# Patient Record
Sex: Female | Born: 1965 | Race: White | Hispanic: No | Marital: Married | Smoking: Former smoker
Health system: Southern US, Community
[De-identification: ages and names within clinical notes are randomized; demographics above are authoritative.]

## PROBLEM LIST (undated history)

## (undated) DIAGNOSIS — Z923 Personal history of irradiation: Secondary | ICD-10-CM

## (undated) DIAGNOSIS — G709 Myoneural disorder, unspecified: Secondary | ICD-10-CM

## (undated) DIAGNOSIS — F329 Major depressive disorder, single episode, unspecified: Secondary | ICD-10-CM

## (undated) DIAGNOSIS — J45909 Unspecified asthma, uncomplicated: Secondary | ICD-10-CM

## (undated) DIAGNOSIS — F419 Anxiety disorder, unspecified: Secondary | ICD-10-CM

## (undated) DIAGNOSIS — C50919 Malignant neoplasm of unspecified site of unspecified female breast: Secondary | ICD-10-CM

## (undated) DIAGNOSIS — F32A Depression, unspecified: Secondary | ICD-10-CM

## (undated) DIAGNOSIS — J449 Chronic obstructive pulmonary disease, unspecified: Secondary | ICD-10-CM

## (undated) HISTORY — PX: ABDOMINAL HYSTERECTOMY: SHX81

---

## 2006-01-08 ENCOUNTER — Ambulatory Visit: Payer: Self-pay | Admitting: Internal Medicine

## 2007-11-29 ENCOUNTER — Ambulatory Visit: Payer: Self-pay | Admitting: Nurse Practitioner

## 2008-12-03 ENCOUNTER — Ambulatory Visit: Payer: Self-pay | Admitting: Nurse Practitioner

## 2009-09-04 ENCOUNTER — Ambulatory Visit: Payer: Self-pay | Admitting: Internal Medicine

## 2009-12-17 ENCOUNTER — Ambulatory Visit: Payer: Self-pay | Admitting: Nurse Practitioner

## 2010-02-18 ENCOUNTER — Ambulatory Visit: Payer: Self-pay | Admitting: Nurse Practitioner

## 2011-02-10 ENCOUNTER — Ambulatory Visit: Payer: Self-pay | Admitting: Family Medicine

## 2012-02-13 ENCOUNTER — Ambulatory Visit: Payer: Self-pay

## 2013-03-06 ENCOUNTER — Ambulatory Visit: Payer: Self-pay

## 2013-06-19 DIAGNOSIS — C50919 Malignant neoplasm of unspecified site of unspecified female breast: Secondary | ICD-10-CM

## 2013-06-19 DIAGNOSIS — Z923 Personal history of irradiation: Secondary | ICD-10-CM

## 2013-06-19 HISTORY — DX: Malignant neoplasm of unspecified site of unspecified female breast: C50.919

## 2013-06-19 HISTORY — DX: Personal history of irradiation: Z92.3

## 2013-12-19 DIAGNOSIS — F32A Depression, unspecified: Secondary | ICD-10-CM | POA: Insufficient documentation

## 2013-12-19 DIAGNOSIS — F329 Major depressive disorder, single episode, unspecified: Secondary | ICD-10-CM | POA: Insufficient documentation

## 2013-12-19 DIAGNOSIS — F419 Anxiety disorder, unspecified: Secondary | ICD-10-CM | POA: Insufficient documentation

## 2014-04-20 ENCOUNTER — Ambulatory Visit: Payer: Self-pay

## 2014-04-27 ENCOUNTER — Ambulatory Visit: Payer: Self-pay

## 2014-05-05 ENCOUNTER — Ambulatory Visit: Payer: Self-pay

## 2014-05-05 HISTORY — PX: BREAST BIOPSY: SHX20

## 2014-05-19 ENCOUNTER — Ambulatory Visit: Payer: Self-pay | Admitting: Surgery

## 2014-05-19 HISTORY — PX: BREAST BIOPSY: SHX20

## 2014-05-26 ENCOUNTER — Ambulatory Visit: Payer: Self-pay | Admitting: Oncology

## 2014-05-27 ENCOUNTER — Other Ambulatory Visit: Payer: Self-pay | Admitting: Oncology

## 2014-05-27 DIAGNOSIS — C50919 Malignant neoplasm of unspecified site of unspecified female breast: Secondary | ICD-10-CM

## 2014-05-28 ENCOUNTER — Ambulatory Visit: Payer: Self-pay | Admitting: Surgery

## 2014-06-01 ENCOUNTER — Ambulatory Visit
Admission: RE | Admit: 2014-06-01 | Discharge: 2014-06-01 | Disposition: A | Discharge status: Home / Self Care | Payer: 59 | Source: Ambulatory Visit | Attending: Oncology | Admitting: Oncology

## 2014-06-01 DIAGNOSIS — C50919 Malignant neoplasm of unspecified site of unspecified female breast: Secondary | ICD-10-CM

## 2014-06-01 MED ORDER — GADOBENATE DIMEGLUMINE 529 MG/ML IV SOLN
18.0000 mL | Freq: Once | INTRAVENOUS | Status: AC | PRN
  Administered 2014-06-01: 18 mL via INTRAVENOUS

## 2014-06-02 ENCOUNTER — Other Ambulatory Visit: Payer: Self-pay

## 2014-06-05 ENCOUNTER — Ambulatory Visit: Payer: Self-pay | Admitting: Surgery

## 2014-06-05 HISTORY — PX: BREAST LUMPECTOMY: SHX2

## 2014-06-19 ENCOUNTER — Ambulatory Visit: Payer: Self-pay | Admitting: Oncology

## 2014-07-20 ENCOUNTER — Ambulatory Visit: Payer: Self-pay | Admitting: Oncology

## 2014-08-18 ENCOUNTER — Ambulatory Visit
Admit: 2014-08-18 | Disposition: A | Discharge status: Home / Self Care | Payer: Self-pay | Attending: Oncology | Admitting: Oncology

## 2014-09-14 DIAGNOSIS — E785 Hyperlipidemia, unspecified: Secondary | ICD-10-CM | POA: Insufficient documentation

## 2014-09-18 ENCOUNTER — Ambulatory Visit
Admit: 2014-09-18 | Disposition: A | Discharge status: Home / Self Care | Payer: Self-pay | Attending: Oncology | Admitting: Oncology

## 2014-10-02 ENCOUNTER — Other Ambulatory Visit: Payer: Self-pay | Admitting: Oncology

## 2014-10-02 DIAGNOSIS — Z853 Personal history of malignant neoplasm of breast: Secondary | ICD-10-CM

## 2014-10-10 NOTE — Op Note (Signed)
PATIENT NAME:  Amanda Winters, Amanda Winters MR#:  [id number] DATE OF BIRTH:  1966-02-03  DATE OF PROCEDURE:  06/05/2014  PREOPERATIVE DIAGNOSIS: Ductal carcinoma in situ of the right breast.   POSTOPERATIVE DIAGNOSIS: Ductal carcinoma in situ of the right breast.   PROCEDURE: Right partial mastectomy.   SURGEON: Loreli Dollar, MD  ANESTHESIA: General.   INDICATIONS: This 49 year old female recently had a mammogram depicting a cluster of microcalcifications in the inferior aspect of the right breast. Stereotactic needle biopsy demonstrated ductal carcinoma in situ and surgery was recommended for definitive treatment.   DESCRIPTION OF PROCEDURE: The patient was placed on the operating table in the supine position under general anesthesia. The dressing was removed from the right breast, exposing the Kopans wire in the inferior aspect of the breast. I reviewed her mammogram images prior to incision. The wire was cut 2 cm from the skin and the breast was prepared with ChloraPrep and draped in a sterile manner.   A transversely oriented curvilinear incision was made just about 6 mm from the border of the areola. From approximately 4-o'clock to eight o'clock position of the right breast, an ellipse of skin was excised, which was approximately 1 cm in width. Dissection was carried down through subcutaneous tissues. Electrocautery was used for hemostasis. The dissection was carried down, noting the location of the Kopans wire and dissected down around the wire. There was some firmness in the breast tissue, which helped to direct the course of the dissection, and the dissection was carried down beyond the tip of the wire. The specimen was removed and was tagged with the margin maps to suture markers to the medial, lateral, cranial, caudal, and deep margins for the pathologist's orientation. There was no grossly palpable tumor at the cut edges. The specimen was submitted for specimen mammogram and pathology. The wound was  inspected and several tiny bleeding points were cauterized. One clamped vessel was suture ligated with 4-0 chromic. The wound was thoroughly inspected and determined hemostasis was intact. It was noted that during the course of the procedure, the radiologist called to report that the biopsy marker was seen within the specimen mammogram. A number of sites surrounding cautery artifact were infiltrated with 0.5% Sensorcaine with epinephrine. Subcutaneous tissues were infiltrated. The subcutaneous tissues were approximated with interrupted 4-0 chromic, and the skin was closed with running 4-0 Monocryl subcuticular suture.   I awaited the pathologist call, and pathologist advised that margins appeared to be good.   Subsequently, the incision was treated with LiquiBand adhesive glue. The patient appeared to tolerate the procedure satisfactorily and was prepared for transfer to the recovery room.   ____________________________ Lenna Sciara. Rochel Brome, MD jws:mw D: 06/05/2014 12:28:12 ET T: 06/05/2014 15:52:03 ET JOB#: [id number]  cc: Loreli Dollar, MD, <Dictator> Loreli Dollar MD ELECTRONICALLY SIGNED 06/08/2014 10:45

## 2014-10-12 LAB — SURGICAL PATHOLOGY

## 2014-10-18 NOTE — Consult Note (Signed)
Reason for Visit: This 49 year old Female patient presents to the clinic for initial evaluation of  breast cancer .   Referred by Dr. Tamala Julian.  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 She also had benign calcifications the left breast both areas underwent ultrasound-guided biopsy right breast showing ER/PR positive ductal carcinoma in situ.Jodelle Red wide local excision for 1.5 cm grade 3 ductal carcinoma in situ with clear margins at 5 mm. Patient seen by medical oncology and aromatase inhibitor therapy after completion of radiation has been recommended. She has done well postoperatively. She specifically denies breast tenderness cough or bone pain.  Past Hx:    breast cancer:    Bronchitis: Gets Bronchitis every year  Past, Family and Social History:  Past Medical History positive   Cardiovascular hyperlipidemia   Respiratory bronchitis; allergic rhinitis   Family History positive   Family History Comments grandmother at 28 with breast cancer   Social History noncontributory   Additional Past Medical and Surgical History seen by herself today   Allergies:   MSG: Swelling  Home Meds:  Home Medications: Medication Instructions Status  tamoxifen 20 mg oral tablet 1 tab(s) orally once a day Active  gabapentin 300 mg oral capsule 1 cap(s) orally 2 times a day Active   venlafaxine extended release 150 mg oral capsule, extended release 1 cap(s) orally once a day AM Active  Norco 325 mg-5 mg oral tablet 1 -2 tab(s) orally every 4 hours as needed for pain Active  Wellbutrin 150 mg/24 hours oral tablet, extended release 1 tab(s) orally  daily AM Active  vitamin E 1000 intl units oral capsule 1 cap(s) orally once a day Active  aspirin 81 mg oral tablet 1 tab(s) orally once a day Active   Review of Systems:  General negative   Performance Status (ECOG) 0   Skin negative   Breast see HPI   Ophthalmologic negative   ENMT negative   Respiratory and Thorax negative   Cardiovascular negative   Gastrointestinal negative   Genitourinary negative   Musculoskeletal negative   Neurological negative   Psychiatric negative   Hematology/Lymphatics negative   Endocrine negative   Allergic/Immunologic negative   Review of Systems   denies any weight loss, fatigue, weakness, fever, chills or night sweats. Patient denies any loss of vision, blurred vision. Patient denies any ringing  of the ears or hearing loss. No irregular heartbeat. Patient denies heart murmur or history of fainting. Patient denies any chest pain or pain radiating to her upper extremities. Patient denies any shortness of breath, difficulty breathing at night, cough or hemoptysis. Patient denies any swelling in the lower legs. Patient denies any nausea vomiting, vomiting of blood, or coffee ground material in the vomitus. Patient denies any stomach pain. Patient states has had normal bowel movements no significant constipation or diarrhea. Patient denies any dysuria, hematuria or significant nocturia. Patient denies any problems walking, swelling in the joints  or loss of balance. Patient denies any skin changes, loss of hair or loss of weight. Patient denies any excessive worrying or anxiety or significant depression. Patient denies any problems with insomnia. Patient denies excessive thirst,  polyuria, polydipsia. Patient denies any swollen glands, patient denies easy bruising or easy bleeding. Patient denies any recent infections, allergies or URI. Patient "s visual fields have not changed significantly in recent time.   Nursing Notes:  Nursing Vital Signs and Chemo Nursing Nursing Notes: *CC Vital Signs Flowsheet:   11-Jan-16 11:32  Temp Temperature 98  Pulse Pulse 79  SBP SBP 132  DBP DBP 88  Current Weight (kg) (kg) 90.9  Height (cm) centimeters 180.3  BSA (m2) 2.1   Physical Exam:  General/Skin/HEENT:  General normal   Skin normal   Eyes normal   ENMT normal   Head and Neck normal   Additional PE well-developed female in NAD breasts are pendulous.she is status post recent wide local excision in the inferior portion of the right nipple areolar complex. No dominant mass or nodularity is noted in either breast in 2 positions examined. No axillary or supraclavicular adenopathy is appreciated. Lungs are clear to A&P cardiac examination shows regar rate and rhythm.   Breasts/Resp/CV/GI/GU:  Respiratory and Thorax normal   Cardiovascular normal   Gastrointestinal normal   Genitourinary normal   MS/Neuro/Psych/Lymph:  Musculoskeletal normal   Neurological normal   Lymphatics normal   Other Results:  Radiology Results: [place]:    09-Nov-15 09:54, Digital Additional Views Bilateral  Digital Additional Views Bilateral   REASON FOR EXAM:    AV BIL CALCIFICATIONS  COMMENTS:       PROCEDURE: MAM - MAM DGTL  ADD VW  BIL SCR  - Apr 27 2014  9:54AM     ADDENDUM REPORT: 04/27/2014 15:19    ADDENDUM:  There is a laterality error in the recommendations. Short-term  follow-upis for the left breast calcifications, with biopsy  recommended for the right breast calcifications.      Electronically Signed    By: Lajean Manes M.D.    On: 04/27/2014 15:19    CLINICAL DATA:  Call back from screening for bilateral  breast  calcifications.    EXAM:  DIGITAL DIAGNOSTIC  BILATERAL MAMMOGRAM    COMPARISON:  Prior exams    ACR Breast Density Category c: The breast tissue is heterogeneously  dense, which may obscure small masses.    FINDINGS:  On the right, there is a discrete group of mildly pleomorphic  calcifications in the inferior aspect of the right breast, anterior  third, new from the prior exams. There is no associated mass or  distortion    On the left, calcifications are noted posteriorly, best seen on the  magnification CC view. These lie in the posterior left breast near  the 11 o'clock position. There is no associated mass or distortion.  They are not well seen on the magnification mL view. They are  punctate configuration.     IMPRESSION:  1. New calcifications on the right are suspicious. Biopsy is  recommended.  2. Probably benign calcifications on the left. Short-term follow-up  recommended.  RECOMMENDATION:  1. Biopsy of the right breast calcifications. They are amenable to  stereotactic core needle biopsy.  2. Short-term follow-up of the right breast calcifications with  repeat diagnostic mammography in 6 months.    I have discussed the findings and recommendations with the patient.  Results were also provided in writing at  the conclusion of the  visit. If applicable, a reminder letter will be sent to the patient  regarding the next appointment.    BI-RADS CATEGORY  4: Suspicious.    Electronically Signed:  By: Lajean Manes M.D.  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 Based on her large breast size hypofractionated course of treatment is not  indicated. We'll plan on delivering 5000 cGy over 5 weeks boosting her scar another 1400 cGy aced on close margin of 5 mm. Risks and benefits of treatment including skin reaction, fatigue, alteration of blood counts, inclusion of superficial lung on the right side all were explained in detail to the patient. She seems to comprehend her treatment plan well. I have ordered and scheduled CT simulation later this week. Patient also be candidate for tamoxifen therapy after completion of radiation and that will be arranged by medical oncology.  I would like to take this opportunity for allowing me to participate in the care of your patient..  Fax to Physician:  Physicians To Recieve Fax: Rochel Brome, MD - [phone].  Electronic Signatures: Armstead Peaks (MD)  (Signed 11-Jan-16 12:30)  Authored: HPI, Diagnosis, Past Hx, PFSH, Allergies, Home Meds, ROS, Nursing Notes, Physical Exam, Other Results, Relevent Results, Encounter Assessment and Plan, Fax to Physician   Last Updated: 11-Jan-16 12:30 by Armstead Peaks (MD)

## 2015-03-09 DIAGNOSIS — N939 Abnormal uterine and vaginal bleeding, unspecified: Secondary | ICD-10-CM | POA: Insufficient documentation

## 2015-03-09 DIAGNOSIS — Z1211 Encounter for screening for malignant neoplasm of colon: Secondary | ICD-10-CM | POA: Insufficient documentation

## 2015-03-25 ENCOUNTER — Other Ambulatory Visit: Payer: Self-pay | Admitting: Oncology

## 2015-04-15 ENCOUNTER — Ambulatory Visit: Payer: Self-pay | Admitting: Radiation Oncology

## 2015-05-04 ENCOUNTER — Telehealth: Payer: Self-pay | Admitting: *Deleted

## 2015-05-04 NOTE — Telephone Encounter (Signed)
Message left to notify patient of appointment changes for Dr. Baruch Gouty, Lab and Dr. Oliva Bustard.

## 2015-05-10 ENCOUNTER — Ambulatory Visit
Admission: RE | Admit: 2015-05-10 | Discharge: 2015-05-10 | Disposition: A | Discharge status: Home / Self Care | Payer: 59 | Source: Ambulatory Visit | Attending: Oncology | Admitting: Oncology

## 2015-05-10 DIAGNOSIS — Z853 Personal history of malignant neoplasm of breast: Secondary | ICD-10-CM | POA: Insufficient documentation

## 2015-05-10 HISTORY — DX: Malignant neoplasm of unspecified site of unspecified female breast: C50.919

## 2015-05-20 ENCOUNTER — Ambulatory Visit: Payer: Self-pay | Admitting: Oncology

## 2015-05-20 ENCOUNTER — Other Ambulatory Visit: Payer: Self-pay

## 2015-05-20 ENCOUNTER — Ambulatory Visit: Payer: Self-pay | Admitting: Radiation Oncology

## 2015-05-21 ENCOUNTER — Encounter
Admission: RE | Admit: 2015-05-21 | Discharge: 2015-05-21 | Disposition: A | Discharge status: Home / Self Care | Payer: 59 | Source: Ambulatory Visit | Attending: Obstetrics and Gynecology | Admitting: Obstetrics and Gynecology

## 2015-05-21 DIAGNOSIS — Z01818 Encounter for other preprocedural examination: Secondary | ICD-10-CM | POA: Insufficient documentation

## 2015-05-21 HISTORY — DX: Major depressive disorder, single episode, unspecified: F32.9

## 2015-05-21 HISTORY — DX: Depression, unspecified: F32.A

## 2015-05-21 HISTORY — DX: Unspecified asthma, uncomplicated: J45.909

## 2015-05-21 HISTORY — DX: Anxiety disorder, unspecified: F41.9

## 2015-05-21 LAB — TYPE AND SCREEN
ABO/RH(D): O POS
ANTIBODY SCREEN: NEGATIVE

## 2015-05-21 LAB — ABO/RH: ABO/RH(D): O POS

## 2015-05-21 NOTE — Patient Instructions (Signed)
  Your procedure is scheduled on: May 31, 2915 (Monday) Report to Day Surgery.[facility]) Second Floor To find out your arrival time please call [phone] between 1PM - 3PM on May 28, 2015 (Friday).  Remember: Instructions that are not followed completely may result in serious medical risk, up to and including death, or upon the discretion of your surgeon and anesthesiologist your surgery may need to be rescheduled.    __x_ 1. Do not eat food or drink liquids after midnight. No gum chewing or hard candies.     __x__ 2. No Alcohol for 24 hours before or after surgery.   ____ 3. Bring all medications with you on the day of surgery if instructed.    __x__ 4. Notify your doctor if there is any change in your medical condition     (cold, fever, infections).     Do not wear jewelry, make-up, hairpins, clips or nail polish.  Do not wear lotions, powders, or perfumes. You may wear deodorant.  Do not shave 48 hours prior to surgery. Men may shave face and neck.  Do not bring valuables to the hospital.    [facility] is not responsible for any belongings or valuables.               Contacts, dentures or bridgework may not be worn into surgery.  Leave your suitcase in the car. After surgery it may be brought to your room.  For patients admitted to the hospital, discharge time is determined by your                treatment team.   Patients discharged the day of surgery will not be allowed to drive home.   Please read over the following fact sheets that you were given:   Surgical Site Infection Prevention   _x_ Take these medicines the morning of surgery with A SIP OF WATER:    1. Wellbutrin  2. Tamoxifen  3.   4.  5.  6.  ____ Fleet Enema (as directed)   __x__ Use CHG Soap as directed  ____ Use inhalers on the day of surgery  ____ Stop metformin 2 days prior to surgery    ____ Take 1/2 of usual insulin dose the night before surgery and none on the morning of  surgery.   ____ Stop Coumadin/Plavix/aspirin on   __x__ Stop Anti-inflammatories on (Stop Ibuprofen now)   ____ Stop supplements until after surgery.    ____ Bring C-Pap to the hospital.

## 2015-05-21 NOTE — H&P (Signed)
Patient ID: Amanda Winters is a 49 y.o. female presenting with Pre Op Consulting  on 04/28/2015  HPI: AUB - menorrhagia, and thickened endometrium, on tamoxifen x7 months.   The patient underwent a transvaginal pelvic sonogram and SIS.  One fundal fibroid seen=39.7 mm Endometrium= 11.98 mm Pt on tamoxifen Neither ov seen No adn masses seen Several endometrial polyps were noted in the irregular endometrial canal.  Pap smear negative 03/09/15 EMBx negative 03/10/15 with secretory features.   Hx of DCIS in right breast, ER+, on tamoxifen  Past Medical History:  has a past medical history of Allergic rhinitis; Anxiety, unspecified; Constipation; COPD (chronic obstructive pulmonary disease); DCIS (ductal carcinoma in situ) (11/15); Depression; Hemorrhoids; Hyperlipidemia; Obesity; Perimenopause; and TMJ (temporomandibular joint syndrome).  Past Surgical History:  has a past surgical history that includes MISCARRIAGE (1999) and Breast surgery (Right, 06/05/2014). Family History: family history includes Depression in her sister; Diabetes mellitus in her mother; Diabetes type II in her mother; HIV in her brother; Hypertension in her father; Leukemia in her sister; Lymphoma in her brother; Skin cancer in her sister; Stroke in her father. Social History:  reports that she has quit smoking. She started smoking about 16 years ago. She has never used smokeless tobacco. She reports that she drinks alcohol. She reports that she does not use illicit drugs. OB/GYN History:  OB History    Gravida Para Term Preterm AB TAB SAB Ectopic Multiple Living   2 1 1  1     1       Allergies: has No Known Allergies. Medications:  Current Outpatient Prescriptions:  . buPROPion (WELLBUTRIN XL) 150 MG XL tablet, Take 1 tablet (150 mg total) by mouth once daily., Disp: 90 tablet, Rfl: 3 . diphenhydrAMINE (BENADRYL) 25 mg capsule, Take 25 mg by mouth every 6 (six) hours as needed for Itching., Disp: , Rfl:   . tamoxifen (NOLVADEX) 20 MG tablet, Take 20 mg by mouth once daily., Disp: , Rfl:  . venlafaxine (EFFEXOR-XR) 75 MG XR capsule, Take 1 capsule (75 mg total) by mouth once daily., Disp: 30 capsule, Rfl: 1  Review of Systems  Constitutional: Negative. Negative for fatigue and fever.  HENT: Negative.  Respiratory: Negative. Negative for shortness of breath.  Cardiovascular: Negative for chest pain and palpitations.  Gastrointestinal: Negative for abdominal pain, constipation and diarrhea.  Endocrine: Negative for cold intolerance.  Genitourinary: Positive for menstrual problem. Negative for difficulty urinating, dyspareunia, dysuria, frequency, hematuria, pelvic pain, vaginal bleeding, vaginal discharge and vaginal pain.  Abnormal uterine bleeding  Neurological: Negative for dizziness and light-headedness.  Psychiatric/Behavioral:  No changes in mood    Exam:         Visit Vitals  . BP 108/75  . Pulse 103  . Wt 90 kg (198 lb 6.4 oz)  . BMI 29.3 kg/m2   Physical Exam  Constitutional: She is oriented to person, place, and time. She appears well-developed and well-nourished. No distress.  Eyes: No scleral icterus.  Neck: Normal range of motion. Neck supple. No tracheal deviation present. No thyromegaly present.  Cardiovascular: Normal rate, regular rhythm and normal heart sounds.  No murmur heard. Pulmonary/Chest: Effort normal and breath sounds normal. She has no wheezes. She has no rales.  Abdominal: She exhibits no distension and no mass. There is no tenderness. There is no rebound and no guarding.  Genitourinary:  Genitourinary Comments:  Pelvic exam: External: Tanner stage 5, normal female genitalia without lesions or masses Bladder: Normal size without masses or tenderness,  well-supported Urethra: No lesions or discharge with palpation. Normal urethral size and location, no prolapse Vagina: normal physiological discharge, without lesions or masses Cervix: normal  without lesions or masses Adnexa: normal bimanual exam without masses or fullness Uterus: Normal size and position without masses or tenderness.  Anus/Perineum: Normal external exam  Musculoskeletal: Normal range of motion.  Lymphadenopathy:  She has no cervical adenopathy.  Neurological: She is alert and oriented to person, place, and time.  Skin: Skin is warm and dry.  Psychiatric: She has a normal mood and affect.          Impression:   The primary encounter diagnosis was Abnormal uterine bleeding (AUB). Diagnoses of Adverse effect of tamoxifen and Endometrial polyp were also pertinent to this visit.    Plan:   Patient returns for a preoperative discussion regarding her plans to proceed with definitive surgical treatment of her AUB and endometrial polyps with ER positive breast cancer and secretory endometrium on tamoxifen by total vaginal hysterectomy with bilateral salpingoophorectomy. We discussed at length the risk/benefit ratio of removing ovaries, which I do recommend at age 17. I spoke with her breast surgeon about this as well.  The patient and I discussed the technical aspects of the procedure including the potential for risks and complications. These include but are not limited to the risk of infection requiring post-operative antibiotics or further procedures. We talked about the risk of injury to adjacent organs including bladder, bowel, ureter, blood vessels or nerves. We talked about the need to convert to an open incision. We talked about the possible need for blood transfusion. We talked aboutpostop complications such asthromboembolic or cardiopulmonary complications. All of her questions were answered. Her preoperative exam was completed and the appropriate consents were signed. She is scheduled to undergo this procedure in the near future.  Return in about 2 weeks (around 05/12/2015) for Postop check.

## 2015-05-24 ENCOUNTER — Other Ambulatory Visit: Payer: Self-pay | Admitting: *Deleted

## 2015-05-24 DIAGNOSIS — C50919 Malignant neoplasm of unspecified site of unspecified female breast: Secondary | ICD-10-CM

## 2015-05-27 ENCOUNTER — Inpatient Hospital Stay: Payer: 59 | Attending: Oncology

## 2015-05-27 ENCOUNTER — Ambulatory Visit
Admission: RE | Admit: 2015-05-27 | Discharge: 2015-05-27 | Disposition: A | Discharge status: Home / Self Care | Payer: 59 | Source: Ambulatory Visit | Attending: Radiation Oncology | Admitting: Radiation Oncology

## 2015-05-27 ENCOUNTER — Encounter: Payer: Self-pay | Admitting: Oncology

## 2015-05-27 ENCOUNTER — Inpatient Hospital Stay (HOSPITAL_BASED_OUTPATIENT_CLINIC_OR_DEPARTMENT_OTHER): Payer: 59 | Admitting: Oncology

## 2015-05-27 ENCOUNTER — Encounter: Payer: Self-pay | Admitting: Radiation Oncology

## 2015-05-27 VITALS — BP 124/85 | HR 99 | Temp 98.0°F | Resp 18 | Wt 202.2 lb

## 2015-05-27 DIAGNOSIS — D0511 Intraductal carcinoma in situ of right breast: Secondary | ICD-10-CM | POA: Diagnosis not present

## 2015-05-27 DIAGNOSIS — Z923 Personal history of irradiation: Secondary | ICD-10-CM | POA: Diagnosis not present

## 2015-05-27 DIAGNOSIS — Z79899 Other long term (current) drug therapy: Secondary | ICD-10-CM | POA: Insufficient documentation

## 2015-05-27 DIAGNOSIS — F329 Major depressive disorder, single episode, unspecified: Secondary | ICD-10-CM | POA: Insufficient documentation

## 2015-05-27 DIAGNOSIS — J45909 Unspecified asthma, uncomplicated: Secondary | ICD-10-CM

## 2015-05-27 DIAGNOSIS — E785 Hyperlipidemia, unspecified: Secondary | ICD-10-CM

## 2015-05-27 DIAGNOSIS — Z170 Estrogen receptor positive status [ER+]: Secondary | ICD-10-CM | POA: Insufficient documentation

## 2015-05-27 DIAGNOSIS — F419 Anxiety disorder, unspecified: Secondary | ICD-10-CM | POA: Insufficient documentation

## 2015-05-27 DIAGNOSIS — Z803 Family history of malignant neoplasm of breast: Secondary | ICD-10-CM

## 2015-05-27 DIAGNOSIS — Z79810 Long term (current) use of selective estrogen receptor modulators (SERMs): Secondary | ICD-10-CM | POA: Insufficient documentation

## 2015-05-27 DIAGNOSIS — Z87891 Personal history of nicotine dependence: Secondary | ICD-10-CM | POA: Diagnosis not present

## 2015-05-27 DIAGNOSIS — N939 Abnormal uterine and vaginal bleeding, unspecified: Secondary | ICD-10-CM | POA: Diagnosis not present

## 2015-05-27 DIAGNOSIS — Z853 Personal history of malignant neoplasm of breast: Secondary | ICD-10-CM

## 2015-05-27 DIAGNOSIS — D0510 Intraductal carcinoma in situ of unspecified breast: Secondary | ICD-10-CM

## 2015-05-27 NOTE — Progress Notes (Signed)
Patient scheduled today to see NP, however, patient wanted to see Dr. Oliva Bustard.  States she is having surgery on Monday and needs to speak with him concerning issues she is having.  States her labs done at preadmit testing revealed her BUN and Creatnine to be elevated.  She also c/o being dizzy and nauseated.  States she started Tamoxifen in March and had heavy bleeding for a month.  She is scheduled for hysterectomy Monday.  States she stopped taking Effexor.  Also complains of muscle pain.  States she works out daily and does not understand why the pain.  Further states she gets diarrhea immediately after eating.

## 2015-05-27 NOTE — Progress Notes (Signed)
Radiation Oncology Follow up Note  Name: Amanda Winters   Date:   05/27/2015 MRN:  [id number] DOB: Apr 14, 1966    This 49 y.o. female presents to the clinic today for follow-up for ductal carcinoma in situ status post wide local excision and adjuvant radiation therapy to her right breast..  REFERRING PROVIDER: No ref. provider found  HPI: Patient is a 49 year old female now out close to year having completed radiation therapy to her right breast for ductal carcinoma in situ status post wide local excision. Tumor was ER/PR positive.. She is currently on tamoxifen tolerating that well without side effect. She specifically denies breast tenderness cough or bone pain. Mammograms performed in November were fine showing no evidence of disease.  COMPLICATIONS OF TREATMENT: none  FOLLOW UP COMPLIANCE: keeps appointments   PHYSICAL EXAM:  There were no vitals taken for this visit. Lungs are clear to A&P cardiac examination essentially unremarkable with regular rate and rhythm. No dominant mass or nodularity is noted in either breast in 2 positions examined. Incision is well-healed. No axillary or supraclavicular adenopathy is appreciated. Cosmetic result is excellent. Well-developed well-nourished patient in NAD. HEENT reveals PERLA, EOMI, discs not visualized.  Oral cavity is clear. No oral mucosal lesions are identified. Neck is clear without evidence of cervical or supraclavicular adenopathy. Lungs are clear to A&P. Cardiac examination is essentially unremarkable with regular rate and rhythm without murmur rub or thrill. Abdomen is benign with no organomegaly or masses noted. Motor sensory and DTR levels are equal and symmetric in the upper and lower extremities. Cranial nerves II through XII are grossly intact. Proprioception is intact. No peripheral adenopathy or edema is identified. No motor or sensory levels are noted. Crude visual fields are within normal range.  RADIOLOGY RESULTS: Mammograms are  reviewed  PLAN: At the present time she is doing well with no evidence of disease. I'm please were overall progress. I've asked to see her out in 1 year for follow-up. She continues on tamoxifen without side effect. Follow-up mammograms will be scheduled. She knows to call with any concerns.  I would like to take this opportunity for allowing me to participate in the care of your patient.Armstead Peaks., MD

## 2015-05-27 NOTE — Progress Notes (Signed)
[place] @ [facility] Telephone:([phone]  [phone]     KINGA FLATHERS OB: 1965/11/13  MR#: [id number]  [id number]  Patient Care Team: Sofie Hartigan, MD as PCP - General (Family Medicine)  CHIEF COMPLAINT: Oncology history Subjective: Chief Complaint/Diagnosis:   1,ductal carcinoma in situ high-gradecomedo pattern  diagnosis by ultrasound-guided biopsy of the right breast tis N0 M0 tumor(at 6:00 position) november 17th, 2015. 2.biopsy of left breastnegative for any malignancy. 3.status post lumpectomyDecember 18, 2015. Ductal  carcinoma in situ.No evidence of invasive cancer.  Estrogen receptor positive.  Progesterone receptor positive. Tis N0 M0 tumor Patient has finished radiation therapy now on tamoxifen HPI:    INTERVAL HISTORY: Patient was on tamoxifen and developed vaginal bleeding endometrial biopsy was negative.  Possibility of hysterectomy was planned.  Nephrectomy was recommended by gynecologist.  Patient is extremely anxious.  Emotional.  Depressed.  And has number of questions.  REVIEW OF SYSTEMS:   As described about patient does not want to continue tamoxifentamoxifen. Dawn other hand extremely worried about not taking any anti-hormonal therapy and spared up cancer Then moistened complains for the side effect of anti-hormonal therapy. Worried about oophorectomy.  And then worried about ovarian cancer.  Extremely emotional.  Patient is going through hysterectomy on Monday and oophorectomy has been planned along with it. Patient had number of menopausal symptoms.  Mood swings.  Heart flashes. As per HPI. Otherwise, a complete review of systems is negatve.  PAST MEDICAL HISTORY: Past Medical History  Diagnosis Date  . Breast cancer ([place]) 2015    right breast cancer  . Asthma     no inhalers  . Anxiety   . Depression     PAST SURGICAL HISTORY: Past Surgical History  Procedure Laterality Date  . Breast biopsy Right 05/05/2014    Stereo biopsy  (positive)  . Breast biopsy Left 05/19/2014    stereo biopsy (negative)  . Breast lumpectomy Right 06/05/2014    right breast lumpectomy - chemo & radiation treatment    FAMILY HISTORY Family History  Problem Relation Age of Onset  . Breast cancer Paternal Grandmother   . Diabetes Mother   . Hypercholesterolemia Mother   . Dementia Mother     ADVANCED DIRECTIVES:  No flowsheet data found.  HEALTH MAINTENANCE: Social History  Substance Use Topics  . Smoking status: Former Smoker -- 1.00 packs/day    Types: Cigarettes    Quit date: 09/18/1998  . Smokeless tobacco: Never Used  . Alcohol Use: Yes     Comment: occ   Significant History/PMH:   breast cancer:    Bronchitis: Gets Bronchitis every year  Preventive Screening:  Has patient had any of the following test? Mammography   Last Mammography: 2015   Smoking History: Smoking History Never Smoked.  PFSH: Family History: noncontributory  Comments: grandmother at age   81  had breast tumor  Social History: negative alcohol, negative tobacco  Additional Past Medical and Surgical History: allergic rhinitis..  Hypercholesteremia.     No Known Allergies  Current Outpatient Prescriptions  Medication Sig Dispense Refill  . buPROPion (WELLBUTRIN XL) 150 MG 24 hr tablet Take by mouth.    . venlafaxine XR (EFFEXOR-XR) 75 MG 24 hr capsule Take by mouth.    Marland Kitchen buPROPion (WELLBUTRIN XL) 150 MG 24 hr tablet Take 150 mg by mouth daily.    . diphenhydrAMINE (BENADRYL) 25 mg capsule Take by mouth.    Marland Kitchen ibuprofen (ADVIL,MOTRIN) 200 MG tablet Take 400 mg by mouth  every 6 (six) hours as needed.    . tamoxifen (NOLVADEX) 20 MG tablet Take 1 tablet by mouth once a day 90 tablet 0  . tamoxifen (NOLVADEX) 20 MG tablet Take by mouth.    . venlafaxine XR (EFFEXOR-XR) 75 MG 24 hr capsule   1   No current facility-administered medications for this visit.    OBJECTIVE:  There were no vitals filed for this visit.   There is no weight  on file to calculate BMI.    ECOG FS:0 - Asymptomatic  PHYSICAL EXAM: Entire visit was spent in discussion regarding hysterectomy, oophorectomy, continuing tamoxifen versus any other anti-hormonal therapy  Patient also has an appointment with radiation oncologist who is currently examined breast and other systemic evaluation LAB RESULTS:  No flowsheet data found.  No visits with results within 5 Day(s) from this visit. Latest known visit with results is:  Hospital Outpatient Visit on 05/21/2015  Component Date Value Ref Range Status  . ABO/RH(D) 05/21/2015 O POS   Final  . Antibody Screen 05/21/2015 NEG   Final  . Sample Expiration 05/21/2015 06/04/2015   Final  . Extend sample reason 05/21/2015 NO TRANSFUSIONS OR PREGNANCY IN THE PAST 3 MONTHS   Final  . ABO/RH(D) 05/21/2015 O POS   Final      STUDIES: Mm Diag Breast Tomo Bilateral  05/10/2015  CLINICAL DATA:  2015 lumpectomy inferior right breast, history of benign stereotactic biopsy left calcifications 05/19/2014 12 o'clock position EXAM: DIGITAL DIAGNOSTIC BILATERAL MAMMOGRAM WITH 3D TOMOSYNTHESIS AND CAD COMPARISON:  Previous exam(s). ACR Breast Density Category c: The breast tissue is heterogeneously dense, which may obscure small masses. FINDINGS: Postsurgical scar lower outer quadrant right breast. No suspicious associated findings. No suspicious findings on the left. An area of perceived distortion in the medial left breast did not persist on spot compression or true lateral views performed with tomography. Mammographic images were processed with CAD. IMPRESSION: Benign postoperative appearing RECOMMENDATION: Diagnostic bilateral mammogram in 1 year I have discussed the findings and recommendations with the patient. Results were also provided in writing at the conclusion of the visit. If applicable, a reminder letter will be sent to the patient regarding the next appointment. BI-RADS CATEGORY  2: Benign. Electronically Signed    By: Skipper Cliche M.D.   On: 05/10/2015 10:52    ASSESSMENT: Carcinoma in situ on tamoxifen Patient is perimenopausal Has vaginal bleeding getting planned for hysterectomy nephrectomy Has number of questions which were answered.  Entire visit was spent in discussion.  Reevaluate patient after hysterectomy nephrectomy about possibility of continuing anti-hormonal therapy Patient was advised to stop tamoxifen at present time Duration of visit is 20  minutes and 50% of time was spent discussing  Patient expressed understanding and was in agreement with this plan. She also understands that She can call clinic at any time with any questions, concerns, or complaints.    No matching staging information was found for the patient.  Forest Gleason, MD   05/27/2015 2:19 PM

## 2015-05-29 ENCOUNTER — Encounter: Payer: Self-pay | Admitting: Oncology

## 2015-05-31 ENCOUNTER — Ambulatory Visit: Payer: 59 | Admitting: Anesthesiology

## 2015-05-31 ENCOUNTER — Observation Stay
Admission: RE | Admit: 2015-05-31 | Discharge: 2015-06-01 | Disposition: A | Discharge status: Home / Self Care | Payer: 59 | Source: Ambulatory Visit | Attending: Obstetrics and Gynecology | Admitting: Obstetrics and Gynecology

## 2015-05-31 ENCOUNTER — Encounter: Payer: Self-pay | Admitting: *Deleted

## 2015-05-31 ENCOUNTER — Encounter
Admission: RE | Disposition: A | Discharge status: Home / Self Care | Payer: Self-pay | Source: Ambulatory Visit | Attending: Obstetrics and Gynecology

## 2015-05-31 DIAGNOSIS — Z9889 Other specified postprocedural states: Secondary | ICD-10-CM

## 2015-05-31 DIAGNOSIS — Z853 Personal history of malignant neoplasm of breast: Secondary | ICD-10-CM | POA: Diagnosis not present

## 2015-05-31 DIAGNOSIS — K5900 Constipation, unspecified: Secondary | ICD-10-CM | POA: Diagnosis not present

## 2015-05-31 DIAGNOSIS — Z833 Family history of diabetes mellitus: Secondary | ICD-10-CM | POA: Insufficient documentation

## 2015-05-31 DIAGNOSIS — E785 Hyperlipidemia, unspecified: Secondary | ICD-10-CM | POA: Insufficient documentation

## 2015-05-31 DIAGNOSIS — J449 Chronic obstructive pulmonary disease, unspecified: Secondary | ICD-10-CM | POA: Diagnosis not present

## 2015-05-31 DIAGNOSIS — E669 Obesity, unspecified: Secondary | ICD-10-CM | POA: Insufficient documentation

## 2015-05-31 DIAGNOSIS — Z808 Family history of malignant neoplasm of other organs or systems: Secondary | ICD-10-CM | POA: Diagnosis not present

## 2015-05-31 DIAGNOSIS — M26609 Unspecified temporomandibular joint disorder, unspecified side: Secondary | ICD-10-CM | POA: Diagnosis not present

## 2015-05-31 DIAGNOSIS — N8312 Corpus luteum cyst of left ovary: Secondary | ICD-10-CM | POA: Diagnosis not present

## 2015-05-31 DIAGNOSIS — N939 Abnormal uterine and vaginal bleeding, unspecified: Secondary | ICD-10-CM | POA: Diagnosis present

## 2015-05-31 DIAGNOSIS — Z87891 Personal history of nicotine dependence: Secondary | ICD-10-CM | POA: Diagnosis not present

## 2015-05-31 DIAGNOSIS — J309 Allergic rhinitis, unspecified: Secondary | ICD-10-CM | POA: Insufficient documentation

## 2015-05-31 DIAGNOSIS — Z6829 Body mass index (BMI) 29.0-29.9, adult: Secondary | ICD-10-CM | POA: Diagnosis not present

## 2015-05-31 DIAGNOSIS — Z807 Family history of other malignant neoplasms of lymphoid, hematopoietic and related tissues: Secondary | ICD-10-CM | POA: Insufficient documentation

## 2015-05-31 DIAGNOSIS — N888 Other specified noninflammatory disorders of cervix uteri: Principal | ICD-10-CM | POA: Insufficient documentation

## 2015-05-31 DIAGNOSIS — Z170 Estrogen receptor positive status [ER+]: Secondary | ICD-10-CM | POA: Insufficient documentation

## 2015-05-31 DIAGNOSIS — N920 Excessive and frequent menstruation with regular cycle: Secondary | ICD-10-CM | POA: Insufficient documentation

## 2015-05-31 DIAGNOSIS — Z8249 Family history of ischemic heart disease and other diseases of the circulatory system: Secondary | ICD-10-CM | POA: Diagnosis not present

## 2015-05-31 DIAGNOSIS — N8311 Corpus luteum cyst of right ovary: Secondary | ICD-10-CM | POA: Diagnosis not present

## 2015-05-31 DIAGNOSIS — F329 Major depressive disorder, single episode, unspecified: Secondary | ICD-10-CM | POA: Diagnosis not present

## 2015-05-31 DIAGNOSIS — F419 Anxiety disorder, unspecified: Secondary | ICD-10-CM | POA: Diagnosis not present

## 2015-05-31 DIAGNOSIS — Z823 Family history of stroke: Secondary | ICD-10-CM | POA: Diagnosis not present

## 2015-05-31 DIAGNOSIS — Z818 Family history of other mental and behavioral disorders: Secondary | ICD-10-CM | POA: Diagnosis not present

## 2015-05-31 DIAGNOSIS — D259 Leiomyoma of uterus, unspecified: Secondary | ICD-10-CM | POA: Diagnosis not present

## 2015-05-31 DIAGNOSIS — Z79899 Other long term (current) drug therapy: Secondary | ICD-10-CM | POA: Diagnosis not present

## 2015-05-31 DIAGNOSIS — R938 Abnormal findings on diagnostic imaging of other specified body structures: Secondary | ICD-10-CM | POA: Diagnosis not present

## 2015-05-31 HISTORY — PX: SALPINGOOPHORECTOMY: SHX82

## 2015-05-31 HISTORY — PX: CYSTOSCOPY: SHX5120

## 2015-05-31 HISTORY — PX: VAGINAL HYSTERECTOMY: SHX2639

## 2015-05-31 LAB — CBC
HCT: 36.7 % (ref 35.0–47.0)
Hemoglobin: 12 g/dL (ref 12.0–16.0)
MCH: 29.9 pg (ref 26.0–34.0)
MCHC: 32.7 g/dL (ref 32.0–36.0)
MCV: 91.6 fL (ref 80.0–100.0)
PLATELETS: 295 10*3/uL (ref 150–440)
RBC: 4.01 MIL/uL (ref 3.80–5.20)
RDW: 13.3 % (ref 11.5–14.5)
WBC: 17.7 10*3/uL — AB (ref 3.6–11.0)

## 2015-05-31 LAB — BASIC METABOLIC PANEL
ANION GAP: 5 (ref 5–15)
BUN: 14 mg/dL (ref 6–20)
CALCIUM: 8.5 mg/dL — AB (ref 8.9–10.3)
CO2: 26 mmol/L (ref 22–32)
Chloride: 109 mmol/L (ref 101–111)
Creatinine, Ser: 0.68 mg/dL (ref 0.44–1.00)
Glucose, Bld: 149 mg/dL — ABNORMAL HIGH (ref 65–99)
POTASSIUM: 4.1 mmol/L (ref 3.5–5.1)
SODIUM: 140 mmol/L (ref 135–145)

## 2015-05-31 LAB — POCT PREGNANCY, URINE: PREG TEST UR: NEGATIVE

## 2015-05-31 SURGERY — HYSTERECTOMY, VAGINAL
Anesthesia: General | Site: Vagina | Wound class: Clean Contaminated

## 2015-05-31 MED ORDER — ONDANSETRON HCL 4 MG/2ML IJ SOLN
INTRAMUSCULAR | Status: DC | PRN
  Administered 2015-05-31: 4 mg via INTRAVENOUS

## 2015-05-31 MED ORDER — ROCURONIUM BROMIDE 100 MG/10ML IV SOLN
INTRAVENOUS | Status: DC | PRN
  Administered 2015-05-31 (×2): 20 mg via INTRAVENOUS
  Administered 2015-05-31: 40 mg via INTRAVENOUS
  Administered 2015-05-31 (×2): 10 mg via INTRAVENOUS

## 2015-05-31 MED ORDER — IBUPROFEN 600 MG PO TABS
600.0000 mg | ORAL_TABLET | Freq: Four times a day (QID) | ORAL | Status: DC | PRN
  Administered 2015-06-01 (×2): 600 mg via ORAL
  Filled 2015-05-31: qty 1

## 2015-05-31 MED ORDER — MENTHOL 3 MG MT LOZG: 1.0000 | LOZENGE | OROMUCOSAL | Status: DC | PRN

## 2015-05-31 MED ORDER — OXYCODONE-ACETAMINOPHEN 5-325 MG PO TABS: 1.0000 | ORAL_TABLET | ORAL | Status: DC | PRN

## 2015-05-31 MED ORDER — CYCLOBENZAPRINE HCL 10 MG PO TABS
10.0000 mg | ORAL_TABLET | Freq: Three times a day (TID) | ORAL | Status: DC | PRN
Start: 2015-05-31 — End: 2015-06-01
  Administered 2015-05-31: 10 mg via ORAL
  Filled 2015-05-31 (×2): qty 1

## 2015-05-31 MED ORDER — DOCUSATE SODIUM 100 MG PO CAPS
100.0000 mg | ORAL_CAPSULE | Freq: Two times a day (BID) | ORAL | Status: DC
  Administered 2015-06-01: 100 mg via ORAL
  Filled 2015-05-31: qty 1

## 2015-05-31 MED ORDER — HYDROMORPHONE HCL 1 MG/ML IJ SOLN: 0.2000 mg | INTRAMUSCULAR | Status: DC | PRN

## 2015-05-31 MED ORDER — HYDROMORPHONE HCL 1 MG/ML IJ SOLN
0.2000 mg | INTRAMUSCULAR | Status: DC | PRN
  Administered 2015-05-31 – 2015-06-01 (×2): 0.6 mg via INTRAVENOUS
  Filled 2015-05-31 (×2): qty 1

## 2015-05-31 MED ORDER — LIDOCAINE-EPINEPHRINE 1 %-1:100000 IJ SOLN
INTRAMUSCULAR | Status: DC | PRN
  Administered 2015-05-31: 16 mL

## 2015-05-31 MED ORDER — ONDANSETRON HCL 4 MG PO TABS: 4.0000 mg | ORAL_TABLET | Freq: Four times a day (QID) | ORAL | Status: DC | PRN

## 2015-05-31 MED ORDER — PROPOFOL 10 MG/ML IV BOLUS
INTRAVENOUS | Status: DC | PRN
  Administered 2015-05-31: 170 mg via INTRAVENOUS

## 2015-05-31 MED ORDER — IBUPROFEN 600 MG PO TABS: 600.0000 mg | ORAL_TABLET | Freq: Four times a day (QID) | ORAL | Status: DC | PRN

## 2015-05-31 MED ORDER — FENTANYL CITRATE (PF) 100 MCG/2ML IJ SOLN
INTRAMUSCULAR | Status: DC | PRN
  Administered 2015-05-31 (×8): 50 ug via INTRAVENOUS

## 2015-05-31 MED ORDER — LACTATED RINGERS IV SOLN: INTRAVENOUS | Status: DC

## 2015-05-31 MED ORDER — ONDANSETRON HCL 4 MG/2ML IJ SOLN: 4.0000 mg | Freq: Four times a day (QID) | INTRAMUSCULAR | Status: DC | PRN

## 2015-05-31 MED ORDER — BUPROPION HCL ER (XL) 150 MG PO TB24
150.0000 mg | ORAL_TABLET | Freq: Every day | ORAL | Status: DC
  Administered 2015-06-01: 150 mg via ORAL
  Filled 2015-05-31 (×2): qty 1

## 2015-05-31 MED ORDER — HYDROMORPHONE HCL 1 MG/ML IJ SOLN
0.2500 mg | INTRAMUSCULAR | Status: DC | PRN
  Administered 2015-05-31 (×4): 0.25 mg via INTRAVENOUS

## 2015-05-31 MED ORDER — KETOROLAC TROMETHAMINE 30 MG/ML IJ SOLN: 30.0000 mg | Freq: Once | INTRAMUSCULAR | Status: DC

## 2015-05-31 MED ORDER — CEFAZOLIN SODIUM-DEXTROSE 2-3 GM-% IV SOLR
2.0000 g | INTRAVENOUS | Status: AC
  Administered 2015-05-31 (×2): 2 g via INTRAVENOUS

## 2015-05-31 MED ORDER — OXYCODONE-ACETAMINOPHEN 5-325 MG PO TABS
1.0000 | ORAL_TABLET | ORAL | Status: DC | PRN
  Administered 2015-06-01 (×2): 2 via ORAL
  Filled 2015-05-31 (×2): qty 2

## 2015-05-31 MED ORDER — LACTATED RINGERS IV SOLN
INTRAVENOUS | Status: DC
  Administered 2015-05-31 – 2015-06-01 (×2): via INTRAVENOUS

## 2015-05-31 MED ORDER — SUGAMMADEX SODIUM 200 MG/2ML IV SOLN
INTRAVENOUS | Status: DC | PRN
  Administered 2015-05-31: 183.2 mg via INTRAVENOUS

## 2015-05-31 MED ORDER — DEXAMETHASONE SODIUM PHOSPHATE 10 MG/ML IJ SOLN
INTRAMUSCULAR | Status: DC | PRN
  Administered 2015-05-31: 5 mg via INTRAVENOUS

## 2015-05-31 MED ORDER — KETOROLAC TROMETHAMINE 30 MG/ML IJ SOLN
30.0000 mg | Freq: Once | INTRAMUSCULAR | Status: AC
  Administered 2015-05-31: 30 mg via INTRAVENOUS

## 2015-05-31 MED ORDER — KETOROLAC TROMETHAMINE 30 MG/ML IJ SOLN: 30.0000 mg | Freq: Four times a day (QID) | INTRAMUSCULAR | Status: DC

## 2015-05-31 MED ORDER — MIDAZOLAM HCL 2 MG/2ML IJ SOLN
INTRAMUSCULAR | Status: DC | PRN
  Administered 2015-05-31: 2 mg via INTRAVENOUS

## 2015-05-31 MED ORDER — DOCUSATE SODIUM 100 MG PO CAPS: 100.0000 mg | ORAL_CAPSULE | Freq: Two times a day (BID) | ORAL | Status: DC

## 2015-05-31 MED ORDER — LACTATED RINGERS IV SOLN
INTRAVENOUS | Status: DC
Start: 2015-05-31 — End: 2015-05-31
  Administered 2015-05-31 (×3): via INTRAVENOUS

## 2015-05-31 MED ORDER — FENTANYL CITRATE (PF) 100 MCG/2ML IJ SOLN
25.0000 ug | INTRAMUSCULAR | Status: DC | PRN
  Administered 2015-05-31 (×4): 25 ug via INTRAVENOUS

## 2015-05-31 MED ORDER — ONDANSETRON HCL 4 MG/2ML IJ SOLN: 4.0000 mg | Freq: Once | INTRAMUSCULAR | Status: DC | PRN

## 2015-05-31 MED ORDER — CEFAZOLIN SODIUM-DEXTROSE 2-3 GM-% IV SOLR
INTRAVENOUS | Status: DC | PRN
  Administered 2015-05-31: 2 g via INTRAVENOUS

## 2015-05-31 MED ORDER — PHENYLEPHRINE HCL 10 MG/ML IJ SOLN
INTRAMUSCULAR | Status: DC | PRN
  Administered 2015-05-31 (×2): 100 ug via INTRAVENOUS

## 2015-05-31 MED ORDER — CEFAZOLIN SODIUM-DEXTROSE 2-3 GM-% IV SOLR
2.0000 g | INTRAVENOUS | Status: AC
  Administered 2015-05-31: 2 g via INTRAVENOUS

## 2015-05-31 MED ORDER — LIDOCAINE HCL (CARDIAC) 20 MG/ML IV SOLN
INTRAVENOUS | Status: DC | PRN
  Administered 2015-05-31: 100 mg via INTRAVENOUS

## 2015-05-31 SURGICAL SUPPLY — 51 items
BAG URO DRAIN 2000ML W/SPOUT (MISCELLANEOUS) ×7 IMPLANT
CANISTER SUCT 1200ML W/VALVE (MISCELLANEOUS) ×7 IMPLANT
CATH FOLEY 2WAY  5CC 16FR (CATHETERS) ×2
CATH URTH 16FR FL 2W BLN LF (CATHETERS) ×5 IMPLANT
CLOSURE WOUND 1/2 X4 (GAUZE/BANDAGES/DRESSINGS)
DRAPE LEGGINS SURG 28X43 STRL (DRAPES) ×7 IMPLANT
DRAPE SHEET LG 3/4 BI-LAMINATE (DRAPES) IMPLANT
ELECT BLADE 6.5 EXT (BLADE) ×7 IMPLANT
FILTER LAP SMOKE EVAC STRL (MISCELLANEOUS) IMPLANT
GAUZE PACK 2X3YD (MISCELLANEOUS) ×7 IMPLANT
GLOVE BIO SURGEON STRL SZ 6.5 (GLOVE) ×6 IMPLANT
GLOVE BIO SURGEONS STRL SZ 6.5 (GLOVE) ×1
GLOVE INDICATOR 7.0 STRL GRN (GLOVE) ×7 IMPLANT
GOWN STRL REUS W/ TWL LRG LVL3 (GOWN DISPOSABLE) ×10 IMPLANT
GOWN STRL REUS W/ TWL XL LVL3 (GOWN DISPOSABLE) ×5 IMPLANT
GOWN STRL REUS W/TWL LRG LVL3 (GOWN DISPOSABLE) ×4
GOWN STRL REUS W/TWL XL LVL3 (GOWN DISPOSABLE) ×2
IRRIGATION STRYKERFLOW (MISCELLANEOUS) IMPLANT
IRRIGATOR STRYKERFLOW (MISCELLANEOUS)
IV LACTATED RINGERS 1000ML (IV SOLUTION) IMPLANT
KIT RM TURNOVER CYSTO AR (KITS) IMPLANT
LABEL OR SOLS (LABEL) IMPLANT
LIGASURE BLUNT 5MM 37CM (INSTRUMENTS) IMPLANT
NDL SAFETY 22GX1.5 (NEEDLE) ×7 IMPLANT
NS IRRIG 500ML POUR BTL (IV SOLUTION) ×7 IMPLANT
PACK BASIN MINOR ARMC (MISCELLANEOUS) ×7 IMPLANT
PACK GYN LAPAROSCOPIC (MISCELLANEOUS) ×7 IMPLANT
PAD GROUND ADULT SPLIT (MISCELLANEOUS) ×7 IMPLANT
PAD OB MATERNITY 4.3X12.25 (PERSONAL CARE ITEMS) ×7 IMPLANT
PAD PREP 24X41 OB/GYN DISP (PERSONAL CARE ITEMS) IMPLANT
SET CYSTO W/LG BORE CLAMP LF (SET/KITS/TRAYS/PACK) IMPLANT
SHEARS HARMONIC ACE PLUS 36CM (ENDOMECHANICALS) IMPLANT
SLEEVE ENDOPATH XCEL 5M (ENDOMECHANICALS) IMPLANT
SPONGE XRAY 4X4 16PLY STRL (MISCELLANEOUS) ×14 IMPLANT
STRIP CLOSURE SKIN 1/2X4 (GAUZE/BANDAGES/DRESSINGS) IMPLANT
SURGILUBE 2OZ TUBE FLIPTOP (MISCELLANEOUS) ×21 IMPLANT
SUT PDS AB 2-0 CT1 27 (SUTURE) ×7 IMPLANT
SUT VIC AB 0 CT1 27 (SUTURE) ×8
SUT VIC AB 0 CT1 27XCR 8 STRN (SUTURE) ×20 IMPLANT
SUT VIC AB 0 CT1 36 (SUTURE) ×7 IMPLANT
SUT VIC AB 0 SH 27 (SUTURE) ×7 IMPLANT
SUT VIC AB 2-0 CT1 36 (SUTURE) ×7 IMPLANT
SUT VIC AB 2-0 SH 27 (SUTURE) ×2
SUT VIC AB 2-0 SH 27XBRD (SUTURE) ×5 IMPLANT
SUT VIC AB 2-0 UR6 27 (SUTURE) ×7 IMPLANT
SUT VIC AB 4-0 FS2 27 (SUTURE) ×7 IMPLANT
SYR CONTROL 10ML (SYRINGE) IMPLANT
SYRINGE 10CC LL (SYRINGE) ×7 IMPLANT
TROCAR ENDO BLADELESS 11MM (ENDOMECHANICALS) IMPLANT
TROCAR XCEL NON-BLD 5MMX100MML (ENDOMECHANICALS) IMPLANT
TUBING INSUFFLATOR HEATED (MISCELLANEOUS) IMPLANT

## 2015-05-31 NOTE — Interval H&P Note (Signed)
History and Physical Interval Note:  05/31/2015 4:34 PM  Amanda Winters  has presented today for surgery, with the diagnosis of AUB WITH BREAST CANCER  The various methods of treatment have been discussed with the patient and family. After consideration of risks, benefits and other options for treatment, the patient has consented to  Procedure(s): HYSTERECTOMY VAGINAL a, salpingectomy and bilateral oopherectomy as a surgical intervention .  The patient's history has been reviewed, patient examined, no change in status, stable for surgery.  I have reviewed the patient's chart and labs.  Questions were answered to the patient's satisfaction.     Benjaman Kindler

## 2015-05-31 NOTE — Anesthesia Preprocedure Evaluation (Signed)
Anesthesia Evaluation  Patient identified by MRN, date of birth, ID band Patient awake    Reviewed: Allergy & Precautions, H&P , NPO status , Patient's Chart, lab work & pertinent test results, reviewed documented beta blocker date and time   Airway Mallampati: II  TM Distance: >3 FB Neck ROM: full    Dental  (+) Teeth Intact   Pulmonary neg pulmonary ROS, asthma , Current Smoker, former smoker,    Pulmonary exam normal        Cardiovascular negative cardio ROS Normal cardiovascular exam Rhythm:regular Rate:Normal     Neuro/Psych  Headaches, negative neurological ROS  negative psych ROS   GI/Hepatic negative GI ROS, Neg liver ROS,   Endo/Other  negative endocrine ROS  Renal/GU negative Renal ROS  negative genitourinary   Musculoskeletal   Abdominal   Peds  Hematology negative hematology ROS (+)   Anesthesia Other Findings   Reproductive/Obstetrics negative OB ROS                             Anesthesia Physical Anesthesia Plan  ASA: II  Anesthesia Plan: General ETT   Post-op Pain Management:    Induction:   Airway Management Planned:   Additional Equipment:   Intra-op Plan:   Post-operative Plan:   Informed Consent: I have reviewed the patients History and Physical, chart, labs and discussed the procedure including the risks, benefits and alternatives for the proposed anesthesia with the patient or authorized representative who has indicated his/her understanding and acceptance.     Plan Discussed with: CRNA  Anesthesia Plan Comments:         Anesthesia Quick Evaluation

## 2015-05-31 NOTE — Transfer of Care (Signed)
Immediate Anesthesia Transfer of Care Note  Patient: Amanda Winters  Procedure(s) Performed: Procedure(s): HYSTERECTOMY VAGINAL (N/A) SALPINGO OOPHORECTOMY (Bilateral) CYSTOSCOPY  Patient Location: PACU  Anesthesia Type:General  Level of Consciousness: awake, alert , oriented and patient cooperative  Airway & Oxygen Therapy: Patient Spontanous Breathing and Patient connected to face mask oxygen  Post-op Assessment: Report given to RN and Post -op Vital signs reviewed and stable  Post vital signs: Reviewed and stable  Last Vitals:  Filed Vitals:   05/31/15 1350  BP: 134/118  Pulse: 93  Temp: 36.7 C  Resp: 18    Complications: No apparent anesthesia complications

## 2015-05-31 NOTE — Op Note (Signed)
Izell Carson Schiano PROCEDURE DATE: 05/31/2015  PREOPERATIVE DIAGNOSIS:   1. Abnormal uterine bleeding 2. Endometrial polyps 3. Hx of DCIS ER+ on tamoxifen  POSTOPERATIVE DIAGNOSIS:   Same  SURGEON:   Benjaman Kindler, M.D. ASSISTANT: Boykin Nearing, M.D. OPERATION:  Total Vaginal Hysterectomy, bilateral salpingectomy, bilateral oopherectomy, cystoscopy ANESTHESIA:  General endotracheal.  INDICATIONS: The patient is a 49 y.o. with the above history. The patient made a decision to undergo definite surgical treatment. On the preoperative visit, the risks, benefits, indications, and alternatives of the procedure were reviewed with the patient.  On the day of surgery, the risks of surgery were again discussed with the patient including but not limited to: bleeding which may require transfusion or reoperation; infection which may require antibiotics; injury to bowel, bladder, ureters or other surrounding organs; need for additional procedures; thromboembolic phenomenon, incisional problems and other postoperative/anesthesia complications. Written informed consent was obtained.    OPERATIVE FINDINGS: A 8 week size uterus with normal tubes and ovaries bilaterally.  ESTIMATED BLOOD LOSS: 450 ml FLUIDS:  2300 ml of Lactated Ringers URINE OUTPUT:  200 ml of clear yellow urine. SPECIMENS:  Uterus and cervix, tubes and ovaries sent to pathology COMPLICATIONS:  None immediate.  DESCRIPTION OF PROCEDURE:  The patient received prophylactic intravenous antibiotics and had sequential compression devices applied to her lower extremities while in the preoperative area.    She was taken to the operating room, where she was identified by name and birth date. General anesthesia was administered and was found to be adequate.  She was placed in the dorsal lithotomy position, and was prepped and draped in a sterile manner.  A formal time out procedure was performed with all team members present and in agreement.  A Foley catheter was inserted into her bladder and attached to gravity drainage. Attention was turned to her pelvis. Of note, all sutures used in this case were 0 Vicryl unless otherwise noted.     A weighted speculum was placed in the vagina, and the anterior and posterior lips of the cervix were grasped bilaterally with thyroid tenaculums.  The cervix was then injected circumferentially with 0.25% Marcaine with epinephrine solution to maintain hemostasis.  The cervix was circumferentially incised using electrocautery, and the posterior cul-de-sac was entered sharply in the midline.   A long weighted speculum was inserted into the posterior cul-de-sac. The bladder was dissected off the pubocervical fascia anteriorly with sharp and careful blunt dissection without complication.  The anterior cul-de-sac was then entered sharply without difficulty and the bladder retracted out of the operative field behind a retractor. The Heaney clamp was then used to clamp the uterosacral ligaments on either side.  They were then cut and sutured ligated with 0 Vicryl, and the ligated uterosacral ligaments were transfixed to the ipsilateral vaginal epithelium to further support the vagina and provide hemostasis. The cardinal ligaments were then clamped, cut and ligated. The uterine vessels and broad ligaments were then serially clamped with the Heaney clamps, cut, and suture ligated on both sides.  Excellent hemostasis was noted at this point.    The uterus was then delivered via the posterior cul-de-sac, and the cornua were clamped with the Heaney clamps, transected, and the uterine specimen was delivered and sent to pathology. These pedicles were then suture ligated to ensure hemostasis.   BILATERAL SALPINGOOPHERECTOMY The Fallopian tubes were then identified and individually grasped with Babcock clamps. They were clamped across entirely with a Heaney clamp and free-tied, followed by suture ligation for  excellent hemostasis  bilaterally. The ovaries were similarly removed. On the patient's right side, bleeding at the IP and pelvic sidewall was encountered, which accounted for most of the blood loss. This was controlled with 2-0 Vicryl sutures.  After completion of the hysterectomy, all pedicles from the uterosacral ligament to the cornua were examined hemostasis was confirmed.  The peritoneum was closed in a purse string fashion with 2-0 Vicryl, taking care not to incorporate any intraabdominal organs in the closure. The posterior peritoneum was fairly oozy, was was controlled with a stitch. The vaginal cuff was then closed with in a running locked fashion with care given to incorporate the uterosacral pedicles bilaterally, which were also tied in the midline.  All instruments were then removed from the pelvis.  CYSTOCOPY  The Foley catheter was temporarily removed and an uncomplicated cystoscopy was performed. Excellent efflux was noted from both ureteral orifices. The bladder mucosa was intact. The Foley catheter was replaced for postoperative care.   Vaginal packing was placed.  The patient tolerated the procedure well.  All instruments, needles, and sponge counts were correct x 2. The patient was taken to the recovery room in stable condition.    Angelina Pih, MD, MPH

## 2015-05-31 NOTE — Anesthesia Procedure Notes (Signed)
Procedure Name: Intubation Date/Time: 05/31/2015 5:06 PM Performed by: Aline Brochure Pre-anesthesia Checklist: Patient identified, Emergency Drugs available, Suction available and Patient being monitored Patient Re-evaluated:Patient Re-evaluated prior to inductionOxygen Delivery Method: Circle system utilized Preoxygenation: Pre-oxygenation with 100% oxygen Intubation Type: IV induction Ventilation: Mask ventilation without difficulty Laryngoscope Size: Mac and 3 Grade View: Grade I Tube type: Oral Tube size: 7.0 mm Number of attempts: 1 Airway Equipment and Method: Stylet Placement Confirmation: ETT inserted through vocal cords under direct vision,  positive ETCO2 and breath sounds checked- equal and bilateral Secured at: 22 cm Tube secured with: Tape Dental Injury: Teeth and Oropharynx as per pre-operative assessment

## 2015-06-01 ENCOUNTER — Encounter: Payer: Self-pay | Admitting: Obstetrics and Gynecology

## 2015-06-01 DIAGNOSIS — N888 Other specified noninflammatory disorders of cervix uteri: Secondary | ICD-10-CM | POA: Diagnosis not present

## 2015-06-01 LAB — BASIC METABOLIC PANEL
Anion gap: 4 — ABNORMAL LOW (ref 5–15)
BUN: 11 mg/dL (ref 6–20)
CHLORIDE: 107 mmol/L (ref 101–111)
CO2: 27 mmol/L (ref 22–32)
Calcium: 8.4 mg/dL — ABNORMAL LOW (ref 8.9–10.3)
Creatinine, Ser: 0.64 mg/dL (ref 0.44–1.00)
GFR calc Af Amer: >60 mL/min (ref >60)
GFR calc non Af Amer: >60 mL/min (ref >60)
GLUCOSE: 124 mg/dL — AB (ref 65–99)
POTASSIUM: 4.5 mmol/L (ref 3.5–5.1)
SODIUM: 138 mmol/L (ref 135–145)

## 2015-06-01 LAB — CBC
HEMATOCRIT: 32 % — AB (ref 35.0–47.0)
Hemoglobin: 10.7 g/dL — ABNORMAL LOW (ref 12.0–16.0)
MCH: 30.7 pg (ref 26.0–34.0)
MCHC: 33.6 g/dL (ref 32.0–36.0)
MCV: 91.3 fL (ref 80.0–100.0)
Platelets: 271 10*3/uL (ref 150–440)
RBC: 3.5 MIL/uL — AB (ref 3.80–5.20)
RDW: 13.3 % (ref 11.5–14.5)
WBC: 11.3 10*3/uL — AB (ref 3.6–11.0)

## 2015-06-01 MED ORDER — OXYCODONE-ACETAMINOPHEN 5-325 MG PO TABS: 1.0000 | ORAL_TABLET | ORAL | Status: DC | PRN

## 2015-06-01 MED ORDER — DOCUSATE SODIUM 100 MG PO CAPS: 100.0000 mg | ORAL_CAPSULE | Freq: Two times a day (BID) | ORAL | Status: DC

## 2015-06-01 MED ORDER — IBUPROFEN 600 MG PO TABS: 600.0000 mg | ORAL_TABLET | Freq: Four times a day (QID) | ORAL | Status: AC | PRN

## 2015-06-01 NOTE — Discharge Summary (Signed)
Physician Discharge Summary  Patient ID: Amanda Winters MRN: [id number] DOB/AGE: 25-Apr-1966 49 y.o.  Admit date: 05/31/2015 Discharge date: 06/01/2015  Admission Diagnoses: AUB, postop care  Discharge Diagnoses:  Active Problems:   Postoperative state   Discharged Condition: good  Hospital Course:  1 Day Post-Op Subjective: The patient is doing well.  No nausea or vomiting. Pain is adequately controlled. She is tolerating po diet, ambulating without complaint, voiding appropriately.  Objective: Vital signs in last 24 hours: Temp:  [97.5 F (36.4 C)-98.8 F (37.1 C)] 98.5 F (36.9 C) (12/13 0728) Pulse Rate:  [93-102] 96 (12/13 0728) Resp:  [10-18] 18 (12/13 0728) BP: (93-134)/(48-118) 93/53 mmHg (12/13 0728) SpO2:  [88 %-99 %] 97 % (12/13 0728) Weight:  [91.627 kg (202 lb)] 91.627 kg (202 lb) (12/12 1350)  Intake/Output  Intake/Output Summary (Last 24 hours) at 06/01/15 0901 Last data filed at 06/01/15 [id number]  Gross per 24 hour  Intake   3100 ml  Output   1925 ml  Net   1175 ml    Physical Exam:  General: Alert and oriented. CV: RRR Lungs: Clear bilaterally. GI: Soft, Nondistended. Incisions: Clean and dry. Urine: Clear Extremities: Nontender, no erythema, no edema.  Lab Results:  Recent Labs  05/31/15 2058 06/01/15 0634  HGB 12.0 10.7*  HCT 36.7 32.0*  WBC 17.7* 11.3*  PLT 295 271                 Results for orders placed or performed during the hospital encounter of 05/31/15 (from the past 24 hour(s))  Pregnancy, urine POC     Status: None   Collection Time: 05/31/15  1:49 PM  Result Value Ref Range   Preg Test, Ur NEGATIVE NEGATIVE  CBC     Status: Abnormal   Collection Time: 05/31/15  8:58 PM  Result Value Ref Range   WBC 17.7 (H) 3.6 - 11.0 K/uL   RBC 4.01 3.80 - 5.20 MIL/uL   Hemoglobin 12.0 12.0 - 16.0 g/dL   HCT 36.7 35.0 - 47.0 %   MCV 91.6 80.0 - 100.0 fL   MCH 29.9 26.0 - 34.0 pg   MCHC 32.7 32.0 - 36.0 g/dL   RDW 13.3 11.5 -  14.5 %   Platelets 295 150 - 440 K/uL  Basic metabolic panel     Status: Abnormal   Collection Time: 05/31/15  8:58 PM  Result Value Ref Range   Sodium 140 135 - 145 mmol/L   Potassium 4.1 3.5 - 5.1 mmol/L   Chloride 109 101 - 111 mmol/L   CO2 26 22 - 32 mmol/L   Glucose, Bld 149 (H) 65 - 99 mg/dL   BUN 14 6 - 20 mg/dL   Creatinine, Ser 0.68 0.44 - 1.00 mg/dL   Calcium 8.5 (L) 8.9 - 10.3 mg/dL   GFR calc non Af Amer >60 >60 mL/min   GFR calc Af Amer >60 >60 mL/min   Anion gap 5 5 - 15  CBC     Status: Abnormal   Collection Time: 06/01/15  6:34 AM  Result Value Ref Range   WBC 11.3 (H) 3.6 - 11.0 K/uL   RBC 3.50 (L) 3.80 - 5.20 MIL/uL   Hemoglobin 10.7 (L) 12.0 - 16.0 g/dL   HCT 32.0 (L) 35.0 - 47.0 %   MCV 91.3 80.0 - 100.0 fL   MCH 30.7 26.0 - 34.0 pg   MCHC 33.6 32.0 - 36.0 g/dL   RDW 13.3 11.5 - 14.5 %  Platelets 271 150 - 440 K/uL  Basic metabolic panel     Status: Abnormal   Collection Time: 06/01/15  6:34 AM  Result Value Ref Range   Sodium 138 135 - 145 mmol/L   Potassium 4.5 3.5 - 5.1 mmol/L   Chloride 107 101 - 111 mmol/L   CO2 27 22 - 32 mmol/L   Glucose, Bld 124 (H) 65 - 99 mg/dL   BUN 11 6 - 20 mg/dL   Creatinine, Ser 0.64 0.44 - 1.00 mg/dL   Calcium 8.4 (L) 8.9 - 10.3 mg/dL   GFR calc non Af Amer >60 >60 mL/min   GFR calc Af Amer >60 >60 mL/min   Anion gap 4 (L) 5 - 15    Assessment/Plan: POD# 1 s/p TVH, BSO.  1) Ambulate, Incentive spirometry 2) Advance diet as tolerated 3) Transition to oral pain medication 4) D/C Foley 5) Discharge home today anticipated    Benjaman Kindler, MD     Benjaman Kindler 06/01/2015, 9:01 AM    Medication List    STOP taking these medications        acetaminophen 500 MG tablet  Commonly known as:  TYLENOL     tamoxifen 20 MG tablet  Commonly known as:  NOLVADEX      TAKE these medications        buPROPion 150 MG 24 hr tablet  Commonly known as:  WELLBUTRIN XL  Take 150 mg by mouth daily.      diphenhydrAMINE 25 mg capsule  Commonly known as:  BENADRYL  Take by mouth.     docusate sodium 100 MG capsule  Commonly known as:  COLACE  Take 1 capsule (100 mg total) by mouth 2 (two) times daily.     ibuprofen 600 MG tablet  Commonly known as:  ADVIL,MOTRIN  Take 1 tablet (600 mg total) by mouth every 6 (six) hours as needed (mild pain).     oxyCODONE-acetaminophen 5-325 MG tablet  Commonly known as:  PERCOCET/ROXICET  Take 1-2 tablets by mouth every 4 (four) hours as needed (moderate to severe pain (when tolerating fluids)).           Follow-up Information    Follow up with Benjaman Kindler, MD In 2 weeks.   Specialty:  Obstetrics and Gynecology   Why:  For postop check   Contact information:   [place] [address] [phone]       Signed: Benjaman Kindler 06/01/2015, 9:01 AM

## 2015-06-01 NOTE — Discharge Instructions (Signed)
Signs and Symptoms to Report Call our office at [phone] if you have any of the following.   Fever over 100.4 degrees or higher  Severe stomach pain not relieved with pain medications  Bright red bleeding thats heavier than a period that does not slow with rest  To go the bathroom a lot (frequency), you cant hold your urine (urgency), or it hurts when you empty your bladder (urinate)  Chest pain  Shortness of breath  Pain in the calves of your legs  Severe nausea and vomiting not relieved with anti-nausea medications  Signs of infection around your wounds, such as redness, hot to touch, swelling, green/yellow drainage (like pus), bad smelling discharge  Any concerns  What You Can Expect after Surgery  You may have a sore throat because of the tube in your mouth during general anesthesia. This will go away in 2 to 3 days.  You may have some stomach cramps.  You may notice spotting on your panties.  You may have pain around the incision sites.   Activities after Your Discharge Follow these guidelines to help speed your recovery at home:  Do the coughing and deep breathing as you did in the hospital for 2 weeks. Use the small blue breathing device, called the incentive spirometer for 2 weeks.  Dont drive if you are in pain or taking narcotic pain medicine. You may drive when you can safely slam on the brakes, turn the wheel forcefully, and rotate your torso comfortably. This is typically 1-2 weeks. Practice in a parking lot or side street prior to attempting to drive regularly.   Ask others to help with household chores for 4 weeks.  Do not lift anything heavier that 10 pounds for 4-6 weeks. This includes pets, children, and groceries.  Dont do strenuous activities, exercises, or sports like vacuuming, tennis, squash, etc. until your doctor says it is safe to do so. ---If you had a hysterectomy (abdominal, laparoscopic, or vaginal) do not have intercourse for 8  weeks.   Walk as you feel able. Rest often since it may take two or three weeks for your energy level to return to normal.   You may climb stairs  Avoid constipation:   -Eat fruits, vegetables, and whole grains. Eat small meals as your appetite will take time to return to normal.   -Drink 6 to 8 glasses of water each day unless your doctor has told you to limit your fluids.   -Use a laxative or stool softener as needed if constipation becomes a problem. You may take Miralax, metamucil, Citrucil, Colace, Senekot, FiberCon, etc. If this does not relieve the constipation, try two tablespoons of Milk Of Magnesia every 8 hours until your bowels move.   You may shower. Gently wash the wounds with a mild soap and water. Pat dry.  Do not get in a hot tub, swimming pool, etc. until your doctor agrees.  Do not use lotions, oils, powders on the wounds.  Do not douche, use tampons, or have sex until your doctor says it is okay.  Take your pain medicine when you need it. The medicine may not work as well if the pain is bad.  Take the medicines you were taking before surgery. Other medications you will need are pain medications (Norco or Percocet) and nausea medications (Zofran).

## 2015-06-01 NOTE — Progress Notes (Signed)
Patient understands all discharge instructions and the need to make follow up appointments. Patient discharge via wheelchair with auxillary. 

## 2015-06-02 LAB — SURGICAL PATHOLOGY

## 2015-06-15 NOTE — Anesthesia Postprocedure Evaluation (Signed)
Anesthesia Post Note  Patient: Amanda Winters  Procedure(s) Performed: Procedure(s) (LRB): HYSTERECTOMY VAGINAL (N/A) SALPINGO OOPHORECTOMY (Bilateral) CYSTOSCOPY  Patient location during evaluation: PACU Anesthesia Type: General Level of consciousness: awake and alert Pain management: pain level controlled Vital Signs Assessment: post-procedure vital signs reviewed and stable Respiratory status: spontaneous breathing, nonlabored ventilation, respiratory function stable and patient connected to nasal cannula oxygen Cardiovascular status: blood pressure returned to baseline and stable Postop Assessment: no signs of nausea or vomiting Anesthetic complications: no    Last Vitals:  Filed Vitals:   06/01/15 0455 06/01/15 0728  BP: 99/48 93/53  Pulse: 102 96  Temp: 37.1 C 36.9 C  Resp: 18 18    Last Pain:  Filed Vitals:   06/01/15 0945  PainSc: 5                  Martha Clan

## 2015-06-16 ENCOUNTER — Ambulatory Visit: Payer: 59 | Attending: Obstetrics and Gynecology | Admitting: Physical Therapy

## 2015-06-16 ENCOUNTER — Encounter: Payer: Self-pay | Admitting: Physical Therapy

## 2015-06-16 DIAGNOSIS — R279 Unspecified lack of coordination: Secondary | ICD-10-CM

## 2015-06-16 DIAGNOSIS — IMO0001 Reserved for inherently not codable concepts without codable children: Secondary | ICD-10-CM

## 2015-06-16 DIAGNOSIS — M609 Myositis, unspecified: Secondary | ICD-10-CM | POA: Diagnosis present

## 2015-06-16 DIAGNOSIS — M791 Myalgia: Secondary | ICD-10-CM | POA: Diagnosis not present

## 2015-06-16 DIAGNOSIS — R2689 Other abnormalities of gait and mobility: Secondary | ICD-10-CM | POA: Diagnosis present

## 2015-06-16 NOTE — Patient Instructions (Addendum)
No more sleeping with heating pad.  Only use it for 20 min/ hour.    Sleep with a pillow between your knees when sleeping on your side to keep good alignment.    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   Hold off weight training routine and no more sit-ups   Continue walking  ______________________________    3- way child's pose -5 breaths  To stretch back     Every day activities: maintain neutral spine  And activate deep core:   1)  : lifting something light off the floor:   Picking up light objects: A."counterbalance lift" technique with front of hips squared to floor as if there is an imaginary plate on your back that you are trying to keep from falling off of your back). Keep your standing knee slightly unlocked, and the toes of the back foot engaged, ankle at 90 deg so the toes point down  B. lunge with front knee above ankle and toes of back foot tucked under.  To rise, straighten back knee by lowering back heel down, pushing off the [place] of the back foot                  C. Mini squat , exhale on rise    2)  Out of chair:  Feet under knees, hands on hips, nose over toes, exhale on rise   3) Out of bed : log roll (see handout)

## 2015-06-17 NOTE — Therapy (Addendum)
[place] MAIN [facility] SERVICES [address] [place], [place], [address] Phone: [phone]   Fax:  [phone]  Physical Therapy Evaluation  Patient Details  Name: Amanda Winters MRN: [id number] Date of Birth: [birth year] No Data Recorded  Encounter Date: 06/16/2015      PT End of Session - 06/27/15 2048    Visit Number 1   Number of Visits 12   PT Start Time 1600   PT Stop Time [id number]   PT Time Calculation (min) 75 min   Activity Tolerance Patient tolerated treatment well;No increased pain   Behavior During Therapy [facility] for tasks assessed/performed      Past Medical History  Diagnosis Date  . Breast cancer ([place]) 2015    right breast cancer  . Asthma     no inhalers  . Anxiety   . Depression     Past Surgical History  Procedure Laterality Date  . Breast biopsy Right 05/05/2014    Stereo biopsy (positive)  . Breast biopsy Left 05/19/2014    stereo biopsy (negative)  . Breast lumpectomy Right 06/05/2014    right breast lumpectomy -  radiation treatment  . Vaginal hysterectomy N/A 05/31/2015    Procedure: HYSTERECTOMY VAGINAL;  Surgeon: Benjaman Kindler, MD;  Location: [facility] ORS;  Service: Gynecology;  Laterality: N/A;  . Salpingoophorectomy Bilateral 05/31/2015    Procedure: SALPINGO OOPHORECTOMY;  Surgeon: Benjaman Kindler, MD;  Location: [facility] ORS;  Service: Gynecology;  Laterality: Bilateral;  . Cystoscopy  05/31/2015    Procedure: CYSTOSCOPY;  Surgeon: Benjaman Kindler, MD;  Location: [facility] ORS;  Service: Gynecology;;    There were no vitals filed for this visit.  Visit Diagnosis:  Myalgia and myositis - Plan: CANCELED: PT plan of care cert/re-cert  Lack of coordination - Plan: CANCELED: PT plan of care cert/re-cert  Decreased mobility - Plan: CANCELED: PT plan of care cert/re-cert      Subjective Assessment - 06/27/15 2051    Subjective Pt reported chronic R LBP/hip pain posterior that radiates down to posterior knee 8/10.  This pain started in Sept 2016 after running.  Pt received one Tx of  deep tissue massage which decreased the pain from 8/10 to 4/10.   The pain returned severely after she underwent a hysterectomy on 12/12/ 2016.   Pt reported after surgery, pt was not able to walk 2/2  radiating pain bilaterally to heels R > L. Across the past 2 weeks, pt took pain meds, heating pad, aspirin, Ibuprofen, and rested from running. Today, pt feels the achy and throb pain with sitting. Pt is not able to lay on her R side, and sleeps on her L side or her back. When sleeping on her L side, pt does not use a pillow.  Pt 's pain interrupts her sleep 2-3x /night. Pain also increases with sit to stand, walking > past > 2 miles. Pt would like to return to running.    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), Prior to suregery, pt 's exercise routine:  walk 30 min, weight training 10#, dumbbells, sit-ups 200 reps, Hx of 1 vaginal delivery- 4th degree tear with stitches, s/p 2 weeks hysterectomy with restrictions no lifting 10 lbs, no vaccuuming, heavy lifting,    Patient Stated Goals reduce pain by 50% at a 4/10 instead 8/10, better ways for fitness and build core mm safely, return to running  Pain Onset 1 to 4 weeks ago            [facility] PT Assessment - 06/27/15 2044    Assessment   Medical Diagnosis acute LBP    Precautions   Precautions Other (comment)   Precaution Comments no lifting > 10 # , no vaccuuming    Restrictions   Weight Bearing Restrictions No   Home Ecologist residence   Prior Function   Level of Independence Independent   Observation/Other Assessments   Other Surveys  --  [place] 61.4%, [place] Anxiety 56%    Coordination   Gross Motor Movements are Fluid and Coordinated --  chest breathing, able to demo diaphragmatic breathing w/ cue   Sit to Stand   Comments rounded  shoulders, weight bearing on thighs, breathholding  no pain with cue for proper mechanics and exhale    Other:   Other/ Comments lifting with spinal flexion   no pain with mini squat, half lunge with exhale    AROM   Overall AROM Comments hip abd/ER on R with pain/ pulling from upper lumbar pre-Tx:, no pain/ nor pulling sensation post-Tx   PROM   Overall PROM Comments hip IR on R ~25 deg, L ~45 deg    Strength   Overall Strength Comments hip 4-/5 R and pain, 4+/5 no pain with cue to exhale    Palpation   SI assessment  severe mid back mm tensions T3-L3 with hypomobility of SP, decreased post- Tx                   [facility] Adult PT Treatment/Exercise - 06/27/15 2044    Bed Mobility   Bed Mobility --  EOB > supine: longsit to supine, supine to sit: crunch (pain   Transfers   Five time sit to stand comments  poor eccentric control    Comments 24.11 sec    pain to posterior thigh on R    Ambulation/Gait   Gait velocity pre-Tx; 0.9 m/s antalgic with decreased R stance phase, post-Tx: 1.08 m/s normal gait    Manual Therapy   Joint Mobilization Grade II-III mob Thoracic segments30 sec x 3    Soft tissue mobilization paraspinals midback bilaterally                 PT Education - 06/27/15 2048    Education provided Yes   Education Details HEP, POC, gaols, anatomy and physiology   Person(s) Educated Patient   Methods Explanation;Demonstration;Tactile cues;Verbal cues;Handout   Comprehension Verbalized understanding;Returned demonstration             PT Long Term Goals - 06/27/15 2048    PT LONG TERM GOAL #1   Title Pt will report decreased pain with sit to stand and be able to perform faster 5TST from 24.11 sec to < 15sec with proper deep core technique in order to perform ADLs.   Time 12   Period Weeks   Status New   PT LONG TERM GOAL #2   Title Pt will decrease her score on [place] from 43% to < 20% in order to return to ADLs and improve QOLs.   Time 12    Period Weeks   Status New   PT LONG TERM GOAL #3   Title Pt will demonstrate decreased thoracic mm tensions and increased thoracic segmental mobility in order to realign spine and pelvis for optimal deep core function for fitness routines.    Time 12  Period Weeks   Status New   PT LONG TERM GOAL #4   Title Pt will demo increased hip strength to 5/5 bilaterally and increased ROM hip ext to 20 deg bilaterally in order to progress towards return to walking.    Time 12   Period Weeks   Status New   PT LONG TERM GOAL #5   Title Pt demo increased gait speed from 0.9 m/s to 1.2 m/s in order to return to walking > 2 mi.    Time 12   Period Weeks   Status New               Plan - 06/27/15 2049    Clinical Impression Statement Pt is a 49 yo female who c/o chronic R LBP/ hip pain posterior that radiates down to posterior knee. This pain originally started as a running injury in Sept 2016 which had improved by 50% with deep tissue massage but later relapsed after undergoing hysterectomy 12 days ago. This pain limits her from walking, standing, and running. Pt's personal factors that are relevant to her pain include: Hx of a fitness routine that included repeated downward forces onto her pelvic floor (200 crunches/day) and avid running, Hx of perineal injury associated with childbirth, Hx of anxiety. Pt 's clinical exam showed referred pain pattern from hypomobile thoracic spine and increased mm tensions midback in addition poor coordination of deep core mm coordination/ strength, decreased gait speed, antalgic gait, poor posture in sleeping, standing, and  sitting. Given these factors, pt's clinical presentation is unstable and complexity is high.     Pt will benefit from skilled therapeutic intervention in order to improve on the following deficits Abnormal gait;Decreased endurance;Decreased coordination;Increased fascial restricitons;Hypomobility;Decreased cognition;Decreased balance;Decreased  range of motion;Decreased mobility;Decreased activity tolerance;Impaired flexibility;Decreased knowledge of use of DME;Postural dysfunction;Difficulty walking;Decreased strength;Increased muscle spasms;Improper body mechanics   Rehab Potential Good   Clinical Impairments Affecting Rehab Potential Pt is under precautious/ restrictions s/p hysterectomy (no lifting 10 lbs, no vaccuuming, heavy lifting)     PT Frequency 2x / week   PT Duration 12 weeks   PT Treatment/Interventions ADLs/Self Care Home Management;Aquatic Therapy;Cryotherapy;Electrical Stimulation;Moist Heat;Traction;Patient/family education;Neuromuscular re-education;Balance training;Therapeutic exercise;Therapeutic activities;Functional mobility training;Stair training;Gait training;Manual techniques;Scar mobilization;Dry needling;Energy conservation;Taping  biopsychosocial approach, relaxation training   Consulted and Agree with Plan of Care Patient         Problem List Patient Active Problem List   Diagnosis Date Noted  . Postoperative state 05/31/2015  . Abnormal uterine bleeding 03/09/2015  . Encounter for gynecological examination without abnormal finding 03/09/2015  . HLD (hyperlipidemia) 09/14/2014  . Anxiety 12/19/2013  . Clinical depression 12/19/2013    Jerl Mina ,PT, DPT, E-RYT  06/27/2015, 9:59 PM  [place] MAIN [facility] SERVICES [address] [place], [place], [address] Phone: [phone]   Fax:  [phone]  Name: CYRIL CALENDER MRN: [id number] Date of Birth: 10-20-1965

## 2015-06-24 ENCOUNTER — Ambulatory Visit: Payer: 59 | Attending: Obstetrics and Gynecology | Admitting: Physical Therapy

## 2015-06-24 DIAGNOSIS — R2689 Other abnormalities of gait and mobility: Secondary | ICD-10-CM | POA: Diagnosis present

## 2015-06-24 DIAGNOSIS — IMO0001 Reserved for inherently not codable concepts without codable children: Secondary | ICD-10-CM

## 2015-06-24 DIAGNOSIS — M609 Myositis, unspecified: Secondary | ICD-10-CM | POA: Diagnosis present

## 2015-06-24 DIAGNOSIS — R279 Unspecified lack of coordination: Secondary | ICD-10-CM | POA: Diagnosis present

## 2015-06-24 DIAGNOSIS — M791 Myalgia: Secondary | ICD-10-CM | POA: Insufficient documentation

## 2015-06-25 ENCOUNTER — Ambulatory Visit: Payer: 59 | Admitting: Physical Therapy

## 2015-06-25 DIAGNOSIS — R279 Unspecified lack of coordination: Secondary | ICD-10-CM

## 2015-06-25 DIAGNOSIS — R2689 Other abnormalities of gait and mobility: Secondary | ICD-10-CM

## 2015-06-25 DIAGNOSIS — M791 Myalgia: Secondary | ICD-10-CM | POA: Diagnosis not present

## 2015-06-25 DIAGNOSIS — IMO0001 Reserved for inherently not codable concepts without codable children: Secondary | ICD-10-CM

## 2015-06-25 NOTE — Therapy (Signed)
[place] MAIN [facility] SERVICES [address] [place], [place], [address] Phone: [phone]   Fax:  [phone]  Physical Therapy Treatment  Patient Details  Name: Amanda Winters MRN: [id number] Date of Birth: 1966/02/10 No Data Recorded  Encounter Date: 06/24/2015      PT End of Session - 06/25/15 2006    Visit Number 2   Number of Visits 12   PT Start Time [id number]   PT Stop Time [id number]   PT Time Calculation (min) 60 min   Activity Tolerance No increased pain;Patient tolerated treatment well   Behavior During Therapy [facility] for tasks assessed/performed      Past Medical History  Diagnosis Date  . Breast cancer ([place]) 2015    right breast cancer  . Asthma     no inhalers  . Anxiety   . Depression     Past Surgical History  Procedure Laterality Date  . Breast biopsy Right 05/05/2014    Stereo biopsy (positive)  . Breast biopsy Left 05/19/2014    stereo biopsy (negative)  . Breast lumpectomy Right 06/05/2014    right breast lumpectomy -  radiation treatment  . Vaginal hysterectomy N/A 05/31/2015    Procedure: HYSTERECTOMY VAGINAL;  Surgeon: Benjaman Kindler, MD;  Location: [facility] ORS;  Service: Gynecology;  Laterality: N/A;  . Salpingoophorectomy Bilateral 05/31/2015    Procedure: SALPINGO OOPHORECTOMY;  Surgeon: Benjaman Kindler, MD;  Location: [facility] ORS;  Service: Gynecology;  Laterality: Bilateral;  . Cystoscopy  05/31/2015    Procedure: CYSTOSCOPY;  Surgeon: Benjaman Kindler, MD;  Location: [facility] ORS;  Service: Gynecology;;    There were no vitals filed for this visit.  Visit Diagnosis:  Myalgia and myositis  Lack of coordination  Decreased mobility      Subjective Assessment - 06/25/15 1942    Subjective Pt's pain returned the following after last session. Pt had to sleep on the floor for two nights and take to resort to taking Percocet to manage the pain. Today, pt reports severe pain and frustration re: lack of sleep. Pt reports  compliance with all of her HEP.    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), Prior to suregery, pt 's exercise routine:  walk 30 min, weight training 10#, dumbbells, sit-ups 200 reps, Hx of 1 vaginal delivery- 4th degree tear with stitches, s/p 2 weeks hysterectomy with restrictions no lifting 10 lbs, no vaccuuming, heavy lifting,    Patient Stated Goals reduce pain by 50% at a 4/10 instead 8/10, better ways for fitness and build core mm safely, return to running    Pain Onset 1 to 4 weeks ago            [facility] PT Assessment - 06/25/15 1948    AROM   Overall AROM Comments sidebend R with radiating pain, sidebend L repeated movements 10 x caused centralization of Sx to SIJ    Palpation   SI assessment  moderate hypomobility at T3-L1, PA mob at [facility] at T10-12 referred pain to R hip, decreased referral pattern post=Tx (increased thoracic mobility)     Palpation comment increased mm tensions/tenderness at R coccygeus and obt int R, decreased post-Tx                      [facility] Adult PT Treatment/Exercise - 06/25/15 1954    Self-Care   Other Self-Care Comments  body scan and benefits, heavy emphasis with neuroscience of pain    Therapeutic Activites    ADL's guided pt find comfortable position in supine position to build habit of sleeping on back instead of side sleeping   Neuro Re-ed    Neuro Re-ed Details  posterior tilt of pelvis in supine, pillow closer to gluts relieved LBP compared to pillow under knees ,    Manual Therapy   Joint Mobilization Grade II mob Thoracic segments30 sec x 3    Soft tissue mobilization sustained pressure along Obt Int, Coccygeus R                      PT Long Term Goals - 06/25/15 2014    PT LONG TERM GOAL #1   Title Pt will report decreased pain with sit to stand and be able to perform faster 5TST from 24.11 sec to < 15sec  with proper deep core technique in order to perform ADLs.   Time 12   Period Weeks   Status New   PT LONG TERM GOAL #2   Title Pt will decrease her score on [place] from 43% to < 20% in order to return to ADLs and improve QOLs.   Time 12   Period Weeks   Status New   PT LONG TERM GOAL #3   Title Pt will demonstrate decreased thoracic mm tensions and increased thoracic segmental mobility in order to realign spine and pelvis for optimal deep core function for fitness routines.    Time 12   Period Weeks   Status New   PT LONG TERM GOAL #4   Title Pt will demo increased hip strength to 5/5 bilaterally and increased ROM hip ext to 20 deg bilaterally in order to progress towards return to walking.    Time 12   Period Weeks   Status New               Plan - 06/25/15 2007    Clinical Impression Statement Pt responded positively to repeated movements (sidebend L) with centralization of her R radiating Sx. Pt also responded well to manual Tx to decrease thoracic hypomobility and pelvic floor tensions. Pt was taught to find a comfortable position in supine and reported feeling "relaxed" after guided body scan technique. Anticipate pt will be able to utilize these techniques to promote better sleep and healing. Pt continues to benefit from skilled PT using a biopsychosocial approach.   Pt will benefit from skilled therapeutic intervention in order to improve on the following deficits Abnormal gait;Decreased endurance;Decreased coordination;Increased fascial restricitons;Hypomobility;Decreased cognition;Decreased balance;Decreased range of motion;Decreased mobility;Decreased activity tolerance;Impaired flexibility;Decreased knowledge of use of DME;Postural dysfunction;Difficulty walking;Decreased strength;Increased muscle spasms;Improper body mechanics   Rehab Potential Good   PT Frequency 2x / week   PT Duration 12 weeks   PT Treatment/Interventions ADLs/Self Care Home Management;Aquatic  Therapy;Cryotherapy;Electrical Stimulation;Moist Heat;Traction;Patient/family education;Neuromuscular re-education;Balance training;Therapeutic exercise;Therapeutic activities;Functional mobility training;Stair training;Gait training;Manual techniques;Scar mobilization;Dry needling;Energy conservation;Taping   Consulted and Agree with Plan of Care Patient        Problem List Patient Active Problem List   Diagnosis Date Noted  . Postoperative state 05/31/2015  . Abnormal uterine bleeding 03/09/2015  . Encounter for gynecological examination without abnormal finding 03/09/2015  . HLD (hyperlipidemia) 09/14/2014  . Anxiety 12/19/2013  . Clinical depression 12/19/2013    Jerl Mina ,PT, DPT, E-RYT  06/25/2015, 8:19 PM  [place] MAIN [facility] SERVICES [place], [place],  [place] Phone: [phone]   Fax:  [phone]  Name: Amanda Winters MRN: [id number] Date of Birth: 11/29/1965

## 2015-06-25 NOTE — Therapy (Signed)
[place] MAIN [facility] SERVICES [address] [place], [place], [address] Phone: [phone]   Fax:  [phone]  Physical Therapy Treatment  Patient Details  Name: MAERENE FOUGERE MRN: [id number] Date of Birth: Jun 17, 1966 No Data Recorded  Encounter Date: 06/25/2015      PT End of Session - 06/25/15 2126    Visit Number 2   Number of Visits 12   PT Start Time 1200   PT Stop Time 1300   PT Time Calculation (min) 60 min   Activity Tolerance No increased pain;Patient tolerated treatment well   Behavior During Therapy [facility] for tasks assessed/performed      Past Medical History  Diagnosis Date  . Breast cancer ([place]) 2015    right breast cancer  . Asthma     no inhalers  . Anxiety   . Depression     Past Surgical History  Procedure Laterality Date  . Breast biopsy Right 05/05/2014    Stereo biopsy (positive)  . Breast biopsy Left 05/19/2014    stereo biopsy (negative)  . Breast lumpectomy Right 06/05/2014    right breast lumpectomy -  radiation treatment  . Vaginal hysterectomy N/A 05/31/2015    Procedure: HYSTERECTOMY VAGINAL;  Surgeon: Benjaman Kindler, MD;  Location: [facility] ORS;  Service: Gynecology;  Laterality: N/A;  . Salpingoophorectomy Bilateral 05/31/2015    Procedure: SALPINGO OOPHORECTOMY;  Surgeon: Benjaman Kindler, MD;  Location: [facility] ORS;  Service: Gynecology;  Laterality: Bilateral;  . Cystoscopy  05/31/2015    Procedure: CYSTOSCOPY;  Surgeon: Benjaman Kindler, MD;  Location: [facility] ORS;  Service: Gynecology;;    There were no vitals filed for this visit.  Visit Diagnosis:  Myalgia and myositis  Lack of coordination  Decreased mobility      Subjective Assessment - 06/25/15 2100    Subjective Pt reported she had no radiating pain last night and was able to sleep longer on her bed in a supine position using modifications she learned from yesterday's session in combination with intake of Motrin and application of heating  pad. Today, she feels severe tenderness along her R lateral thigh.     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), Prior to suregery, pt 's exercise routine:  walk 30 min, weight training 10#, dumbbells, sit-ups 200 reps, Hx of 1 vaginal delivery- 4th degree tear with stitches, s/p 2 weeks hysterectomy with restrictions no lifting 10 lbs, no vaccuuming, heavy lifting,    Patient Stated Goals reduce pain by 50% at a 4/10 instead 8/10, better ways for fitness and build core mm safely, return to running    Pain Onset 1 to 4 weeks ago            [facility] PT Assessment - 06/25/15 2111    AROM   Overall AROM Comments pre-Tx:  standing hip extensions R 10 deg. L 20 deg, Post- Tx: 15 deg   L hip ext: 20 deg   Palpation   Palpation comment increased mm tensions at R adductor brevis, magnus, IT Band                      [facility] Adult PT Treatment/Exercise - 06/25/15 2116    Exercises   Other Exercises  see pt instructions   Manual Therapy   Joint Mobilization long axis distraction on R hip/femur, AP mobs hip 90-90    Soft  tissue mobilization adductor brevis, magnus, IT band  attachment sites and mm body                 PT Education - 06/25/15 2125    Education provided Yes   Education Details HEP   Person(s) Educated Patient   Methods Explanation;Demonstration;Tactile cues;Verbal cues;Handout   Comprehension Verbalized understanding;Returned demonstration             PT Long Term Goals - 06/25/15 2014    PT LONG TERM GOAL #1   Title Pt will report decreased pain with sit to stand and be able to perform faster 5TST from 24.11 sec to < 15sec with proper deep core technique in order to perform ADLs.   Time 12   Period Weeks   Status New   PT LONG TERM GOAL #2   Title Pt will decrease her score on [place] from 43% to < 20% in order to return to ADLs and improve QOLs.    Time 12   Period Weeks   Status New   PT LONG TERM GOAL #3   Title Pt will demonstrate decreased thoracic mm tensions and increased thoracic segmental mobility in order to realign spine and pelvis for optimal deep core function for fitness routines.    Time 12   Period Weeks   Status New   PT LONG TERM GOAL #4   Title Pt will demo increased hip strength to 5/5 bilaterally and increased ROM hip ext to 20 deg bilaterally in order to progress towards return to walking.    Time 12   Period Weeks   Status New               Plan - 06/25/15 2126    Clinical Impression Statement Pt presents today in brighter spirits and demonstrates less antalgic gait. Pt demonstrated mm tensions/ severe tenderness in adductor muscles and IT band which dissipated post-Tx. Initiated hip stretches and hip propioception exercise to dissociate lumbar from pelvis.   Most of her R sided hip mm tensions  predominately originate at attachment sites of mm located near ischial tuberosity and ischioramus.Pt reported Hx of 4th degree perineal tear and suspect fascial restriction that may be associated with limited hip extension. Plan to assess perineal scar externally at next session. Contact Dr. Leafy Ro when pt can be cleared for  intravaginal assessment.    Pt will benefit from skilled therapeutic intervention in order to improve on the following deficits Abnormal gait;Decreased endurance;Decreased coordination;Increased fascial restricitons;Hypomobility;Decreased cognition;Decreased balance;Decreased range of motion;Decreased mobility;Decreased activity tolerance;Impaired flexibility;Decreased knowledge of use of DME;Postural dysfunction;Difficulty walking;Decreased strength;Increased muscle spasms;Improper body mechanics   Rehab Potential Good   PT Frequency 2x / week   PT Duration 12 weeks   PT Treatment/Interventions ADLs/Self Care Home Management;Aquatic Therapy;Cryotherapy;Electrical Stimulation;Moist  Heat;Traction;Patient/family education;Neuromuscular re-education;Balance training;Therapeutic exercise;Therapeutic activities;Functional mobility training;Stair training;Gait training;Manual techniques;Scar mobilization;Dry needling;Energy conservation;Taping   Consulted and Agree with Plan of Care Patient        Problem List Patient Active Problem List   Diagnosis Date Noted  . Postoperative state 05/31/2015  . Abnormal uterine bleeding 03/09/2015  . Encounter for gynecological examination without abnormal finding 03/09/2015  . HLD (hyperlipidemia) 09/14/2014  . Anxiety 12/19/2013  . Clinical depression 12/19/2013    Jerl Mina ,PT, DPT, E-RYT  06/25/2015, 9:42 PM  [place] MAIN [facility] SERVICES [address] [place], [place], [address] Phone: [phone]   Fax:  [phone]  Name: MAKALEIGH HAUSCH MRN: [id number] Date  of Birth: 1966/06/12

## 2015-06-25 NOTE — Patient Instructions (Signed)
Open book (Handout)  Frog stretch 10 x with exhale when feet fall apart  Sleeping on back with pillow under thighs, posterior tilt of pelvis to decrease low back pain  Sidebend Left 10 x every 2 hrs for next 48 hrs to decrease radiating pain   Body Scan (emailed)

## 2015-06-25 NOTE — Patient Instructions (Signed)
IT band stretch : Reclined twist with use of props for neutral hip knee alignment  5 breaths R side only   Standing hip ext with feet on plastic bag to slide, focus bringing leg back just before low back fires. The movement comes from hips (less is more when bringing the foot back).   Practice proper standing posture (handout) unlock knees.   You are now ready to begin training the deep core muscles system: diaphragm, transverse abdominis, pelvic floor . These muscles must work together as a team.           The key to these exercises to train the brain to coordinate the timing of these muscles and to have them turn on for long periods of time to hold you upright against gravity (especially important if you are on your feet all day).These muscles are postural muscles and play a role stabilizing your spine and bodyweight. By doing these repetitions slowly and correctly instead of doing crunches, you will achieve a flatter belly without a lower pooch. You are also placing your spine in a more neutral position and breathing properly which in turn, decreases your risk for problems related to your pelvic floor, abdominal, and low back such as pelvic organ prolapse, hernias, diastasis recti (separation of superficial muscles), disk herniations, spinal fractures. These exercises set a solid foundation for you to later progress to resistance/ strength training with therabands and weights and return to other typical fitness exercises with a stronger deeper core.    Level 1 and 2 only

## 2015-06-27 NOTE — Addendum Note (Signed)
Addended by: Jerl Mina on: 06/27/2015 09:45 PM   Modules accepted: Orders

## 2015-06-27 NOTE — Addendum Note (Signed)
Addended by: Jerl Mina on: 06/27/2015 10:02 PM   Modules accepted: Orders

## 2015-06-28 ENCOUNTER — Ambulatory Visit: Payer: 59 | Admitting: Physical Therapy

## 2015-06-29 ENCOUNTER — Other Ambulatory Visit: Payer: Self-pay | Admitting: Oncology

## 2015-06-30 ENCOUNTER — Ambulatory Visit: Payer: 59 | Admitting: Physical Therapy

## 2015-06-30 DIAGNOSIS — R279 Unspecified lack of coordination: Secondary | ICD-10-CM

## 2015-06-30 DIAGNOSIS — IMO0001 Reserved for inherently not codable concepts without codable children: Secondary | ICD-10-CM

## 2015-06-30 DIAGNOSIS — M791 Myalgia: Secondary | ICD-10-CM | POA: Diagnosis not present

## 2015-06-30 DIAGNOSIS — R2689 Other abnormalities of gait and mobility: Secondary | ICD-10-CM

## 2015-06-30 NOTE — Patient Instructions (Signed)
Walking mechanics:  "ski tracks" "belly over feet"  Heel toe __ even imprint in sand "spread evenly across the [place]"     Clam Shell 45 Degrees   Lying with hips and knees bent 45, one pillow between knees and ankles. Lift knee with exhale. Be sure pelvis does not roll backward. Do not arch back. Do 15 x 2 times, right, 2 times per day.  http://ss.exer.[email or web address]    ___________  Sidestep with mini squat down hallway (left and right )  Exhale on rise  2 laps   ___________  Withholding treadmill walking until Friday   Copyright  VHI. All rights reserved.

## 2015-07-01 NOTE — Therapy (Signed)
[place] MAIN [facility] SERVICES [address] [place], [place], [address] Phone: [phone]   Fax:  [phone]  Physical Therapy Treatment  Patient Details  Name: Amanda Winters MRN: [id number] Date of Birth: 1965/12/10 No Data Recorded  Encounter Date: 06/30/2015      PT End of Session - 07/01/15 2049    Visit Number 4   Number of Visits 12   PT Start Time [id number]   PT Stop Time 1315   PT Time Calculation (min) 70 min   Activity Tolerance Patient tolerated treatment well;No increased pain   Behavior During Therapy [facility] for tasks assessed/performed      Past Medical History  Diagnosis Date  . Breast cancer ([place]) 2015    right breast cancer  . Asthma     no inhalers  . Anxiety   . Depression     Past Surgical History  Procedure Laterality Date  . Breast biopsy Right 05/05/2014    Stereo biopsy (positive)  . Breast biopsy Left 05/19/2014    stereo biopsy (negative)  . Breast lumpectomy Right 06/05/2014    right breast lumpectomy -  radiation treatment  . Vaginal hysterectomy N/A 05/31/2015    Procedure: HYSTERECTOMY VAGINAL;  Surgeon: Benjaman Kindler, MD;  Location: [facility] ORS;  Service: Gynecology;  Laterality: N/A;  . Salpingoophorectomy Bilateral 05/31/2015    Procedure: SALPINGO OOPHORECTOMY;  Surgeon: Benjaman Kindler, MD;  Location: [facility] ORS;  Service: Gynecology;  Laterality: Bilateral;  . Cystoscopy  05/31/2015    Procedure: CYSTOSCOPY;  Surgeon: Benjaman Kindler, MD;  Location: [facility] ORS;  Service: Gynecology;;    There were no vitals filed for this visit.  Visit Diagnosis:  Myalgia and myositis  Lack of coordination  Decreased mobility      Subjective Assessment - 07/01/15 2105    Subjective Pt reported feeling "good" after last session. Pt was able to sleep well for 3 days and had walked her routine for 40 min across 3 days before onset of pain. Now the pain exists at 50% less severity, primarily at the R glut  w/radiating pain to heels. The lateral thigh pain that was addressed at last session  has resolved.  Pt feels frustrated  about her relapse of Sx.  Pt finds exercising to be important to her because its her way to release stress due to a stressful job. Pt did report she does not perform a warm up to walking and performs treadmill walking immediately at 3.0 mph to 3.5 mph.    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), Prior to suregery, pt 's exercise routine:  walk 30 min, weight training 10#, dumbbells, sit-ups 200 reps, Hx of 1 vaginal delivery- 4th degree tear with stitches, s/p 2 weeks hysterectomy with restrictions no lifting 10 lbs, no vaccuuming, heavy lifting,    Patient Stated Goals reduce pain by 50% at a 4/10 instead 8/10, better ways for fitness and build core mm safely, return to running    Pain Onset 1 to 4 weeks ago            [facility] PT Assessment - 07/01/15 2048    AROM   Overall AROM Comments hip ext standing 15 deg on R, 20 deg L   Palpation   SI assessment  severe mid back mm tensions T3-L3 with hypomobility of SP, decreased post- Tx   Palpation comment increased tensions  at R obturator int/ext, coccygeus                      [facility] Adult PT Treatment/Exercise - 07/01/15 2057    Ambulation/Gait   Gait Pattern --  pronated feet, decreased COM over tibia on pre swing   Self-Care   Other Self-Care Comments  biopsychosocial strategies, provided pt "Why I still hurt" packet to read.  Short durations of walking( not 40 min at a time yet). Ensure walking with warm up (slower speed), higher speed, cool down as opposed to 3.0 mph to 3.4 mph for 40 min.  anatomy and physiology   Neuro Re-ed    Neuro Re-ed Details  gait mechanics (see pt instructions)    Exercises   Other Exercises  see pt instructions    Manual Therapy   Soft tissue mobilization sustained  pressure on R obt int/ext, coccygeus                 PT Education - 07/01/15 2050    Education provided Yes   Education Details HEP   Person(s) Educated Patient   Methods Explanation;Demonstration;Tactile cues;Verbal cues;Handout   Comprehension Verbalized understanding;Returned demonstration             PT Long Term Goals - 06/27/15 2048    PT LONG TERM GOAL #1   Title Pt will report decreased pain with sit to stand and be able to perform faster 5TST from 24.11 sec to < 15sec with proper deep core technique in order to perform ADLs.   Time 12   Period Weeks   Status New   PT LONG TERM GOAL #2   Title Pt will decrease her score on [place] from 43% to < 20% in order to return to ADLs and improve QOLs.   Time 12   Period Weeks   Status New   PT LONG TERM GOAL #3   Title Pt will demonstrate decreased thoracic mm tensions and increased thoracic segmental mobility in order to realign spine and pelvis for optimal deep core function for fitness routines.    Time 12   Period Weeks   Status New   PT LONG TERM GOAL #4   Title Pt will demo increased hip strength to 5/5 bilaterally and increased ROM hip ext to 20 deg bilaterally in order to progress towards return to walking.    Time 12   Period Weeks   Status New   PT LONG TERM GOAL #5   Title Pt demo increased gait speed from 0.9 m/s to 1.2 m/s in order to return to walking > 2 mi.    Time 12   Period Weeks   Status New               Plan - 07/01/15 2050    Clinical Impression Statement Pt continues to show progress with improved spinal mobility and decreased mm tensions on R hip. Pt's pain decreased by 50% after manual Tx today. Problem areas today included pelvic floor mm near obturator rami on R. Suspect perineal scar will be beneficial to examine with clearance from MD for intravaginal exam to minimize relapse of pain. Pt continued to require biopsychosocial approach with heavy emphasis on building gradually  towards her walking routine as opposed to walking 40 min daily.  Suspect her flare up after 3 days of managed pain was due to overexertion with walking and faulty gait patterns.  Pt remains compliant but needs modifications to her workout  routine to accommodate her need to exercise for stress relief.  Plan to assess her running and walking gait on treadmill which has been her usual routine.    Pt will benefit from skilled therapeutic intervention in order to improve on the following deficits Abnormal gait;Decreased endurance;Decreased coordination;Increased fascial restricitons;Hypomobility;Decreased cognition;Decreased balance;Decreased range of motion;Decreased mobility;Decreased activity tolerance;Impaired flexibility;Decreased knowledge of use of DME;Postural dysfunction;Difficulty walking;Decreased strength;Increased muscle spasms;Improper body mechanics   Rehab Potential Good   Clinical Impairments Affecting Rehab Potential Pt is under precautious/ restrictions s/p hysterectomy.    PT Frequency 2x / week   PT Duration 12 weeks   PT Treatment/Interventions ADLs/Self Care Home Management;Aquatic Therapy;Cryotherapy;Electrical Stimulation;Moist Heat;Traction;Patient/family education;Neuromuscular re-education;Balance training;Therapeutic exercise;Therapeutic activities;Functional mobility training;Stair training;Gait training;Manual techniques;Scar mobilization;Dry needling;Energy conservation;Taping  biopsychosocial approach, relaxation training   Consulted and Agree with Plan of Care Patient        Problem List Patient Active Problem List   Diagnosis Date Noted  . Postoperative state 05/31/2015  . Abnormal uterine bleeding 03/09/2015  . Encounter for gynecological examination without abnormal finding 03/09/2015  . HLD (hyperlipidemia) 09/14/2014  . Anxiety 12/19/2013  . Clinical depression 12/19/2013    Jerl Mina ,PT, DPT, E-RYT  07/01/2015, 9:05 PM  [place] MAIN [facility] SERVICES [address] [place], [place], [address] Phone: [phone]   Fax:  [phone]  Name: Amanda Winters MRN: [id number] Date of Birth: 10-May-1966

## 2015-07-02 ENCOUNTER — Ambulatory Visit: Payer: 59 | Admitting: Physical Therapy

## 2015-07-02 DIAGNOSIS — M791 Myalgia: Secondary | ICD-10-CM | POA: Diagnosis not present

## 2015-07-02 DIAGNOSIS — R279 Unspecified lack of coordination: Secondary | ICD-10-CM

## 2015-07-02 DIAGNOSIS — IMO0001 Reserved for inherently not codable concepts without codable children: Secondary | ICD-10-CM

## 2015-07-02 DIAGNOSIS — R2689 Other abnormalities of gait and mobility: Secondary | ICD-10-CM

## 2015-07-02 NOTE — Therapy (Signed)
[place] MAIN [facility] SERVICES [address] [place], [place], [address] Phone: [phone]   Fax:  [phone]  Physical Therapy Treatment  Patient Details  Name: Amanda Winters MRN: [id number] Date of Birth: [birth year] No Data Recorded  Encounter Date: 07/02/2015      PT End of Session - 07/02/15 1948    Visit Number 5   Number of Visits 12   PT Start Time 1210   PT Stop Time 1310   PT Time Calculation (min) 60 min   Activity Tolerance Patient tolerated treatment well;No increased pain   Behavior During Therapy [facility] for tasks assessed/performed      Past Medical History  Diagnosis Date  . Breast cancer ([place]) 2015    right breast cancer  . Asthma     no inhalers  . Anxiety   . Depression     Past Surgical History  Procedure Laterality Date  . Breast biopsy Right 05/05/2014    Stereo biopsy (positive)  . Breast biopsy Left 05/19/2014    stereo biopsy (negative)  . Breast lumpectomy Right 06/05/2014    right breast lumpectomy -  radiation treatment  . Vaginal hysterectomy N/A 05/31/2015    Procedure: HYSTERECTOMY VAGINAL;  Surgeon: Benjaman Kindler, MD;  Location: [facility] ORS;  Service: Gynecology;  Laterality: N/A;  . Salpingoophorectomy Bilateral 05/31/2015    Procedure: SALPINGO OOPHORECTOMY;  Surgeon: Benjaman Kindler, MD;  Location: [facility] ORS;  Service: Gynecology;  Laterality: Bilateral;  . Cystoscopy  05/31/2015    Procedure: CYSTOSCOPY;  Surgeon: Benjaman Kindler, MD;  Location: [facility] ORS;  Service: Gynecology;;    There were no vitals filed for this visit.  Visit Diagnosis:  Myalgia and myositis  Lack of coordination  Decreased mobility      Subjective Assessment - 07/02/15 1210    Subjective Pt reported she had no radiating pain since last session. Currently she feels "annnoying pain" located at her R glut radiating along posterior/lateral thigh above her knee. Pt feels depressed about her pain and feels a need to  exercise to release stress.     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), Prior to suregery, pt 's exercise routine:  walk 30 min, weight training 10#, dumbbells, sit-ups 200 reps, Hx of 1 vaginal delivery- 4th degree tear with stitches, s/p 2 weeks hysterectomy with restrictions no lifting 10 lbs, no vaccuuming, heavy lifting,    Patient Stated Goals reduce pain by 50% at a 4/10 instead 8/10, better ways for fitness and build core mm safely, return to running    Pain Onset 1 to 4 weeks ago            [facility] PT Assessment - 07/02/15 1318    Coordination   Fine Motor Movements are Fluid and Coordinated --  cue for bowel movement w/ bearing down, overuse of gluts   Palpation   Palpation comment increased fascial tensions/tenderness along R sacral edge from S2-4, obturator rami                   Pelvic Floor Special Questions - 07/02/15 1336    Scar Restricted   Scar other longitinual scar from anus to vagina     External Palpation tenderness posterior bulbospongious bilaterally           [facility] Adult PT Treatment/Exercise - 07/02/15 1318    Ambulation/Gait   Gait Comments  narrow BOS with walking. 2': 0.37mph sidestep R, 2': 0.7 mph walking, 2': 1.0 mph, 2': 0.7 mph, 2: 0.5 mph     Self-Care   Other Self-Care Comments  biopsychosocial explanations and anatomy and physiology education   Neuro Re-ed    Neuro Re-ed Details  gait mechanics (wider BOS, increased hip flexion with increased speed)    Exercises   Other Exercises  cross over stretch with belt on RLE over LLE  5 breaths supine, and in standing with UE support by treadmill bilaterally      Manual Therapy   Joint Mobilization AP mob w/ hip in full flexion, R LE add over L to mobilization sacrum from ilium on R , pt reported relief post-Tx   Soft tissue mobilization fascial releases over lateral edge of SIJ,  surrounding areas of R obturator rami    Myofascial Release thiele massage, MWM over perineal scar with pt's verbal consent and no contraindication noted. MD clearance obtained through email.                 PT Education - 07/02/15 1948    Education provided Yes   Education Details HEP   Person(s) Educated Patient   Methods Explanation;Demonstration;Tactile cues;Verbal cues;Handout   Comprehension Verbalized understanding;Returned demonstration             PT Long Term Goals - 06/27/15 2048    PT LONG TERM GOAL #1   Title Pt will report decreased pain with sit to stand and be able to perform faster 5TST from 24.11 sec to < 15sec with proper deep core technique in order to perform ADLs.   Time 12   Period Weeks   Status New   PT LONG TERM GOAL #2   Title Pt will decrease her score on [place] from 43% to < 20% in order to return to ADLs and improve QOLs.   Time 12   Period Weeks   Status New   PT LONG TERM GOAL #3   Title Pt will demonstrate decreased thoracic mm tensions and increased thoracic segmental mobility in order to realign spine and pelvis for optimal deep core function for fitness routines.    Time 12   Period Weeks   Status New   PT LONG TERM GOAL #4   Title Pt will demo increased hip strength to 5/5 bilaterally and increased ROM hip ext to 20 deg bilaterally in order to progress towards return to walking.    Time 12   Period Weeks   Status New   PT LONG TERM GOAL #5   Title Pt demo increased gait speed from 0.9 m/s to 1.2 m/s in order to return to walking > 2 mi.    Time 12   Period Weeks   Status New               Plan - 07/02/15 1948    Clinical Impression Statement Pt shows improvement with centralization fo Sx across this week's sessions. Pt required continued biopsychosocial strategies to counter her frustrations and depressive attititudes towards her condition. After today's Tx, pt's "annoying" pain decreased from R posterior glut/ lateral  thigh to pinpointed area at R PSIS with palpation. Pt showed perineal scar restriction and fascial restrictions over R glut/ obturator rami , all of which showed increased mobility post-Tx. Suspect pt's fascial restrictions are contributed to perineal scar restrictions and pt's faulty running gait patterns (short stride, decreased hip flex/ext) .  Anticipate pt will continue to achieve her  goals.      Pt will benefit from skilled therapeutic intervention in order to improve on the following deficits Abnormal gait;Decreased endurance;Decreased coordination;Increased fascial restricitons;Hypomobility;Decreased cognition;Decreased balance;Decreased range of motion;Decreased mobility;Decreased activity tolerance;Impaired flexibility;Decreased knowledge of use of DME;Postural dysfunction;Difficulty walking;Decreased strength;Increased muscle spasms;Improper body mechanics   Rehab Potential Good   Clinical Impairments Affecting Rehab Potential Pt is under precautious/ restrictions s/p hysterectomy.    PT Frequency 2x / week   PT Duration 12 weeks   PT Treatment/Interventions ADLs/Self Care Home Management;Aquatic Therapy;Cryotherapy;Electrical Stimulation;Moist Heat;Traction;Patient/family education;Neuromuscular re-education;Balance training;Therapeutic exercise;Therapeutic activities;Functional mobility training;Stair training;Gait training;Manual techniques;Scar mobilization;Dry needling;Energy conservation;Taping  biopsychosocial approach, relaxation training   Consulted and Agree with Plan of Care Patient        Problem List Patient Active Problem List   Diagnosis Date Noted  . Postoperative state 05/31/2015  . Abnormal uterine bleeding 03/09/2015  . Encounter for gynecological examination without abnormal finding 03/09/2015  . HLD (hyperlipidemia) 09/14/2014  . Anxiety 12/19/2013  . Clinical depression 12/19/2013    Jerl Mina ,PT, DPT, E-RYT  07/02/2015, 7:58 PM  [place] MAIN [facility] SERVICES [address] [place], [place], [address] Phone: [phone]   Fax:  [phone]  Name: Amanda Winters MRN: [id number] Date of Birth: 03-10-1966

## 2015-07-02 NOTE — Patient Instructions (Signed)
Cross over stretch:   R thigh over left, belt behind thighs, pull closer to chest on exhale  10 x long breaths    ________________________  Walking on treadmill,    WARM-UP 5 min: 0.4 mph sidestepping right  5 min: 0.7 mph walking (wider feet under hips)   PEAKING 5 min: 1.0  mph  5 min: 1.5 mph ( pick up knees as speed increases)   COOL-DOWN 5 min: 1.0 -0.7 mph    5 min: 0.4 mph sidestepping right

## 2015-07-05 ENCOUNTER — Encounter: Payer: Self-pay | Admitting: Oncology

## 2015-07-05 ENCOUNTER — Inpatient Hospital Stay: Payer: 59 | Attending: Oncology | Admitting: Oncology

## 2015-07-05 ENCOUNTER — Ambulatory Visit: Payer: 59 | Admitting: Physical Therapy

## 2015-07-05 VITALS — BP 144/82 | HR 106 | Temp 97.3°F | Resp 18 | Wt 198.6 lb

## 2015-07-05 DIAGNOSIS — D0511 Intraductal carcinoma in situ of right breast: Secondary | ICD-10-CM | POA: Diagnosis not present

## 2015-07-05 DIAGNOSIS — Z90722 Acquired absence of ovaries, bilateral: Secondary | ICD-10-CM

## 2015-07-05 DIAGNOSIS — Z170 Estrogen receptor positive status [ER+]: Secondary | ICD-10-CM

## 2015-07-05 DIAGNOSIS — Z803 Family history of malignant neoplasm of breast: Secondary | ICD-10-CM

## 2015-07-05 DIAGNOSIS — R279 Unspecified lack of coordination: Secondary | ICD-10-CM

## 2015-07-05 DIAGNOSIS — Z90710 Acquired absence of both cervix and uterus: Secondary | ICD-10-CM | POA: Diagnosis not present

## 2015-07-05 DIAGNOSIS — D0590 Unspecified type of carcinoma in situ of unspecified breast: Secondary | ICD-10-CM

## 2015-07-05 DIAGNOSIS — F329 Major depressive disorder, single episode, unspecified: Secondary | ICD-10-CM | POA: Diagnosis not present

## 2015-07-05 DIAGNOSIS — J45909 Unspecified asthma, uncomplicated: Secondary | ICD-10-CM | POA: Diagnosis not present

## 2015-07-05 DIAGNOSIS — Z87891 Personal history of nicotine dependence: Secondary | ICD-10-CM | POA: Diagnosis not present

## 2015-07-05 DIAGNOSIS — R232 Flushing: Secondary | ICD-10-CM | POA: Insufficient documentation

## 2015-07-05 DIAGNOSIS — IMO0001 Reserved for inherently not codable concepts without codable children: Secondary | ICD-10-CM

## 2015-07-05 DIAGNOSIS — Z923 Personal history of irradiation: Secondary | ICD-10-CM | POA: Diagnosis not present

## 2015-07-05 DIAGNOSIS — M791 Myalgia: Secondary | ICD-10-CM | POA: Diagnosis not present

## 2015-07-05 DIAGNOSIS — E785 Hyperlipidemia, unspecified: Secondary | ICD-10-CM

## 2015-07-05 DIAGNOSIS — Z79899 Other long term (current) drug therapy: Secondary | ICD-10-CM

## 2015-07-05 DIAGNOSIS — R2689 Other abnormalities of gait and mobility: Secondary | ICD-10-CM

## 2015-07-05 NOTE — Progress Notes (Signed)
[place] @ [facility] [phone]  [phone]     Amanda Winters OB: 04/03/1966  MR#: [id number]  ERX#:[id number]  Patient Care Team: Sofie Hartigan, MD as PCP - General (Family Medicine)  CHIEF COMPLAINT: Oncology history Subjective: Chief Complaint/Diagnosis:   1,ductal carcinoma in situ high-gradecomedo pattern  diagnosis by ultrasound-guided biopsy of the right breast tis N0 M0 tumor(at 6:00 position) november 17th, 2015. 2.biopsy of left breastnegative for any malignancy. 3.status post lumpectomyDecember 18, 2015. Ductal  carcinoma in situ.No evidence of invasive cancer.  Estrogen receptor positive.  Progesterone receptor positive. Tis N0 M0 tumor Patient has finished radiation therapy now on tamoxifen Patient has been off tamoxifen because of vaginal bleeding (December, 2016) Patient had bilateral oophorectomy and hysterectomy in December of 2016 HPI:    INTERVAL HISTORY: Patient was on tamoxifen and developed vaginal bleeding endometrial biopsy was negative.  Possibility of hysterectomy was planned.  Nephrectomy was recommended by gynecologist.  Patient is extremely anxious.  Emotional.  Depressed.  And has number of questions.  He should not is here for further evaluation and treatment consideration.  Patient had bilateral oophorectomy and hysterectomy is having hot flashes.  Patient is off tamoxifen. REVIEW OF SYSTEMS:   As described about patient does not want to continue tamoxifentamoxifen. Dawn other hand extremely worried about not taking any anti-hormonal therapy and spared up cancer Then moistened complains for the side effect of anti-hormonal therapy. Worried about oophorectomy.  And then worried about ovarian cancer.  Extremely emotional.  Patient is going through hysterectomy on Monday and oophorectomy has been planned along with it. Patient had number of menopausal symptoms.  Mood swings.  Heart flashes. As per HPI. Otherwise, a complete review  of systems is negatve.  PAST MEDICAL HISTORY: Past Medical History  Diagnosis Date  . Breast cancer ([place]) 2015    right breast cancer  . Asthma     no inhalers  . Anxiety   . Depression     PAST SURGICAL HISTORY: Past Surgical History  Procedure Laterality Date  . Breast biopsy Right 05/05/2014    Stereo biopsy (positive)  . Breast biopsy Left 05/19/2014    stereo biopsy (negative)  . Breast lumpectomy Right 06/05/2014    right breast lumpectomy -  radiation treatment  . Vaginal hysterectomy N/A 05/31/2015    Procedure: HYSTERECTOMY VAGINAL;  Surgeon: Benjaman Kindler, MD;  Location: [facility] ORS;  Service: Gynecology;  Laterality: N/A;  . Salpingoophorectomy Bilateral 05/31/2015    Procedure: SALPINGO OOPHORECTOMY;  Surgeon: Benjaman Kindler, MD;  Location: [facility] ORS;  Service: Gynecology;  Laterality: Bilateral;  . Cystoscopy  05/31/2015    Procedure: CYSTOSCOPY;  Surgeon: Benjaman Kindler, MD;  Location: [facility] ORS;  Service: Gynecology;;    FAMILY HISTORY Family History  Problem Relation Age of Onset  . Breast cancer Paternal Grandmother   . Diabetes Mother   . Hypercholesterolemia Mother   . Dementia Mother     ADVANCED DIRECTIVES:  No flowsheet data found.  HEALTH MAINTENANCE: Social History  Substance Use Topics  . Smoking status: Former Smoker -- 1.00 packs/day    Types: Cigarettes    Quit date: 09/18/1998  . Smokeless tobacco: Never Used  . Alcohol Use: Yes     Comment: occ   Significant History/PMH:   breast cancer:    Bronchitis: Gets Bronchitis every year  Preventive Screening:  Has patient had any of the following test? Mammography   Last Mammography: 2015   Smoking History: Smoking History Never Smoked.  PFSH: Family History: noncontributory  Comments: grandmother at age   61  had breast tumor  Social History: negative alcohol, negative tobacco  Additional Past Medical and Surgical History: allergic rhinitis..  Hypercholesteremia.     No  Known Allergies  Current Outpatient Prescriptions  Medication Sig Dispense Refill  . buPROPion (WELLBUTRIN XL) 150 MG 24 hr tablet Take 150 mg by mouth daily.    . calcium carbonate 1250 MG capsule Take 1,250 mg by mouth 2 (two) times daily with a meal.    . cholecalciferol (VITAMIN D) 1000 units tablet Take 1,000 Units by mouth daily.    . diphenhydrAMINE (BENADRYL) 25 mg capsule Take by mouth.    . EVENING PRIMROSE OIL PO Take by mouth.    Marland Kitchen ibuprofen (ADVIL,MOTRIN) 600 MG tablet Take 1 tablet (600 mg total) by mouth every 6 (six) hours as needed (mild pain). 30 tablet 0  . Multiple Vitamins-Minerals (CENTRUM SILVER PO) Take by mouth.     No current facility-administered medications for this visit.    OBJECTIVE:  Filed Vitals:   07/05/15 1450  BP: 144/82  Pulse: 106  Temp: 97.3 F (36.3 C)  Resp: 18     Body mass index is 29.32 kg/(m^2).    ECOG FS:0 - Asymptomatic  PHYSICAL EXAM: Entire visit was spent in discussion regarding hysterectomy, oophorectomy, continuing tamoxifen versus any other anti-hormonal therapy  Patient also has an appointment with radiation oncologist who is currently examined breast and other systemic evaluation LAB RESULTS:  CBC Latest Ref Rng 06/01/2015 05/31/2015  WBC 3.6 - 11.0 K/uL 11.3(H) 17.7(H)  Hemoglobin 12.0 - 16.0 g/dL 10.7(L) 12.0  Hematocrit 35.0 - 47.0 % 32.0(L) 36.7  Platelets 150 - 440 K/uL 271 295    No visits with results within 5 Day(s) from this visit. Latest known visit with results is:  Admission on 05/31/2015, Discharged on 06/01/2015  Component Date Value Ref Range Status  . WBC 05/31/2015 17.7* 3.6 - 11.0 K/uL Final  . RBC 05/31/2015 4.01  3.80 - 5.20 MIL/uL Final  . Hemoglobin 05/31/2015 12.0  12.0 - 16.0 g/dL Final  . HCT 05/31/2015 36.7  35.0 - 47.0 % Final  . MCV 05/31/2015 91.6  80.0 - 100.0 fL Final  . MCH 05/31/2015 29.9  26.0 - 34.0 pg Final  . MCHC 05/31/2015 32.7  32.0 - 36.0 g/dL Final  . RDW 05/31/2015  13.3  11.5 - 14.5 % Final  . Platelets 05/31/2015 295  150 - 440 K/uL Final  . Sodium 05/31/2015 140  135 - 145 mmol/L Final  . Potassium 05/31/2015 4.1  3.5 - 5.1 mmol/L Final  . Chloride 05/31/2015 109  101 - 111 mmol/L Final  . CO2 05/31/2015 26  22 - 32 mmol/L Final  . Glucose, Bld 05/31/2015 149* 65 - 99 mg/dL Final  . BUN 05/31/2015 14  6 - 20 mg/dL Final  . Creatinine, Ser 05/31/2015 0.68  0.44 - 1.00 mg/dL Final  . Calcium 05/31/2015 8.5* 8.9 - 10.3 mg/dL Final  . GFR calc non Af Amer 05/31/2015 >60  >60 mL/min Final  . GFR calc Af Amer 05/31/2015 >60  >60 mL/min Final   Comment: (NOTE) The eGFR has been calculated using the CKD EPI equation. This calculation has not been validated in all clinical situations. eGFR's persistently <60 mL/min signify possible Chronic Kidney Disease.   . Anion gap 05/31/2015 5  5 - 15 Final  . Preg Test, Ur 05/31/2015 NEGATIVE  NEGATIVE Final   Comment:  THE SENSITIVITY OF THIS METHODOLOGY IS >24 mIU/mL   . SURGICAL PATHOLOGY 05/31/2015    Final                   Value:Surgical Pathology CASE: [id number] PATIENT: Bianey Feldkamp Surgical Pathology Report     SPECIMEN SUBMITTED: A. Uterus w/cervix, bilateral ovaries  fall. tubes  CLINICAL HISTORY: None provided  PRE-OPERATIVE DIAGNOSIS: AUB with breast cancer  POST-OPERATIVE DIAGNOSIS: Same as pre-op     DIAGNOSIS: A. UTERUS WITH CERVIX, BILATERAL FALLOPIAN TUBES AND OVARIES; HYSTERECTOMY WITH BILATERAL SALPINGO-OOPHORECTOMY: - CERVIX WITH NABOTHIAN CYSTS. - BENIGN ENDOMETRIAL POLYP (0.6 CM) AND BACKGROUND PROLIFERATIVE ENDOMETRIUM. - LEIOMYOMATA, UP TO 2.7 CM; WITHOUT ATYPIA, NECROSIS OR INCREASED MITOSES. - OVARIES WITH HEMORRHAGIC CORPUS LUTEUM AND STROMAL HYPERPLASIA. - BILATERAL FALLOPIAN TUBES WITHOUT PATHOLOGIC CHANGE. - NEGATIVE FOR HYPERPLASIA, DYSPLASIA AND MALIGNANCY.   GROSS DESCRIPTION:  A. The specimen is received in a formalin-filled container  labeled with the patient's name and uterus with cervix, bilateral ovaries and fallopian tubes.  Adnexa: a                         dnexa in 4 fragment Weight: uterus in 4 fragment and 150 grams Dimensions:      Fundus -4.3 x 4.5 x 4.1 cm      Cervix -4.6 x 5.2 cm Serosa: pink-tan smooth Cervix: pink with focal ecchymosis Endocervix: trabecular pink-tan Endometrial cavity:      Dimensions -uterine body disrupted into 3 fragment, cannot be determined      Thickness -in the intact areas 0.1 cm      Other findings -in an area of disruption (0.6 x 0.6 x 0.4 cm) polyp attached Myometrium:     Thickness -in the intact areas up to 3 cm     Other findings -2 white whorled nodules 0.5 and 2.7 cm in greatest dimension Adnexa:       First is a 2.5 x 1.5 x 1.0 cm grossly intact ovary with 3.2 x 1.0 x 0.6 cm fragment of attached fibrous tissue. The second a 1.7 x 2.0 x 0.6 cm lobulated pink-tan fragment suggestive of  ovary. The last 2 fragment are fimbriated fallopian tubes, the first 1.5 cm long 0.9 cm in diameter the second 3 cm long up to 1 cm in diameter.  Other comments: [default value]  Block                          summary: 1-2-representative both sides of cervix 3-4-entire uterine polypoid area 5-6-representative both sides endomyometrium 7-representative large white nodule 8-9-entire first described ovary 10-11-entire second described ovary 12-entire first described fallopian tube with fimbriated end longitudinal 13-entire second described fallopian tube with fimbriated end longitudinal  Final Diagnosis performed by Delorse Lek, MD.  Electronically signed 06/02/2015 12:50:59PM    The electronic signature indicates that the named Attending Pathologist has evaluated the specimen  Technical component performed at [facility], [address] [place], [place] [address] Lab: [id number] Dir: Darrick Penna. Evette Doffing, MD  Professional component performed at [facility], [facility], [place], [place], [place] [address] Lab: [phone] Dir: Dellia Nims. Rubinas, MD    . WBC 06/01/2015 11.3* 3.6 - 11.0 K/uL Final  . RBC 06/01/2015 3.50* 3.80 - 5.20 MIL/uL Final  . Hemoglobin 06/01/2015 10.7* 12.0 - 16.0 g/dL Final  . HCT 06/01/2015 32.0* 35.0 - 47.0 % Final  . MCV 06/01/2015 91.3  80.0 - 100.0 fL Final  . MCH 06/01/2015 30.7  26.0 - 34.0 pg Final  . MCHC 06/01/2015 33.6  32.0 - 36.0 g/dL Final  . RDW 06/01/2015 13.3  11.5 - 14.5 % Final  . Platelets 06/01/2015 271  150 - 440 K/uL Final  . Sodium 06/01/2015 138  135 - 145 mmol/L Final  . Potassium 06/01/2015 4.5  3.5 - 5.1 mmol/L Final  . Chloride 06/01/2015 107  101 - 111 mmol/L Final  . CO2 06/01/2015 27  22 - 32 mmol/L Final  . Glucose, Bld 06/01/2015 124* 65 - 99 mg/dL Final  . BUN 06/01/2015 11  6 - 20 mg/dL Final  . Creatinine, Ser 06/01/2015 0.64  0.44 - 1.00 mg/dL Final  . Calcium 06/01/2015 8.4* 8.9 - 10.3 mg/dL Final  . GFR calc non Af Amer 06/01/2015 >60  >60 mL/min Final  . GFR calc Af Amer 06/01/2015 >60  >60 mL/min Final   Comment: (NOTE) The eGFR has been calculated using the CKD EPI equation. This calculation has not been validated in all clinical situations. eGFR's persistently <60 mL/min signify possible Chronic Kidney Disease.   . Anion gap 06/01/2015 4* 5 - 15 Final      STUDIES: No results found.  ASSESSMENT: Carcinoma in situ on tamoxifen Off tamoxifen Patient is perimenopausal Patient has undergone bilateral hysterectomy and oophorectomy  Patient and number of questions regarding continuing anti-hormonal therapy.  Because of Heart flashes and other issues related with osteoporosis and is starting aromatase inhibitor patient decided to hold off any further anti-hormonal therapy And reevaluate situation in next few months.  [facility] report of hysterectomy was reviewed there was no evidence of malignancy Bilateral diagnostic mammogram is been  scheduled  Patient expressed understanding and was in agreement with this plan. She also understands that She can call clinic at any time with any questions, concerns, or complaints.    No matching staging information was found for the patient.  Forest Gleason, MD   07/05/2015 3:32 PM

## 2015-07-05 NOTE — Addendum Note (Signed)
Addended by: Livia Snellen on: 07/05/2015 03:50 PM   Modules accepted: Orders

## 2015-07-05 NOTE — Progress Notes (Signed)
Patient states she is on a vitamin for hot flashes called Primerose.

## 2015-07-05 NOTE — Patient Instructions (Signed)
Mon, Tues, Wed, Thurs:    At work:  small pelvic circles, sways,   tilts to find slight anterior tilt of pelvis when standing   Cardio:  Remember "less is more" right now.  Do not over do it. Pace your self and it is important that movemnt is slow right now. You will not get the same rush from exercises as you did perform surgery but you will gradually get back to it, just not right now.  Or else the nerve will get irritated from getting back too soon without pro)   20 min leisure walking outside (level ground) Or Treadmill walking   (ONE OR THE OTHER, and NOT BOTH on the same day)   If treadmill:  WARM-UP 5 min: 0.4 mph sidestepping right   5 min: 0.7 mph walking (wider feet under hips)   PEAKING 5 min: 1.0-1.5  mph    COOL-DOWN 5 min: 1.0 -0.7 mph      3-3-3-deep core strengthening and back strengthening  1)  "w"  With yellow band 10 x 3 reps 2 )  Level 1 : breathing with pelvic floor 10 reps x 3   3)   Level 2 : knee out 15-20 deg without rocking at the pelvis/ spine 10 reps x 3  This week: Practice only Level 1-2. Hold off on 3-4 until our next visit.  You are now ready to begin training the deep core muscles system: diaphragm, transverse abdominis, pelvic floor . These muscles must work together as a team.           The key to these exercises to train the brain to coordinate the timing of these muscles and to have them turn on for long periods of time to hold you upright against gravity (especially important if you are on your feet all day).These muscles are postural muscles and play a role stabilizing your spine and bodyweight. By doing these repetitions slowly and correctly instead of doing crunches, you will achieve a flatter belly without a lower pooch. You are also placing your spine in a more neutral position and breathing properly which in turn, decreases your risk for problems related to your pelvic floor, abdominal, and low back such as pelvic organ prolapse,  hernias, diastasis recti (separation of superficial muscles), disk herniations, spinal fractures. These exercises set a solid foundation for you to later progress to resistance/ strength training with therabands and weights and return to other typical fitness exercises with a stronger deeper core.      Podcast on retraining pain and the importance of utilizing biopsychosocial model [email or web address]  Margarita Sermons, PT, Ph D who has done extensive research on the neuroscience of pain. I took his continuing education course on "Therapeutic Neuroscience Pain Education".  An abbreviated version of "Why Do I Still Hurt" is attached here.  He has also conducted studies where he provided patients this education prior to undergoing back surgery and they had much better outcomes and lowered medical costs.  See under his publications: [email or web address].[email or web address]   Also, he provides great explanations about pain in this interview: [email or web address]  and in this interview: about centralization [email or web address]

## 2015-07-06 NOTE — Therapy (Addendum)
[place] MAIN [facility] SERVICES [address] [place], [place], [address] Phone: [phone]   Fax:  [phone]  Physical Therapy Treatment  Patient Details  Name: Amanda Winters MRN: [id number] Date of Birth: 1966-02-19 No Data Recorded  Encounter Date: 07/05/2015      PT End of Session - 07/06/15 2312    Visit Number 6   Number of Visits 12   PT Start Time [id number]   PT Stop Time [id number]   PT Time Calculation (min) 60 min   Activity Tolerance Patient tolerated treatment well;No increased pain   Behavior During Therapy [facility] for tasks assessed/performed      Past Medical History  Diagnosis Date  . Breast cancer ([place]) 2015    right breast cancer  . Asthma     no inhalers  . Anxiety   . Depression     Past Surgical History  Procedure Laterality Date  . Breast biopsy Right 05/05/2014    Stereo biopsy (positive)  . Breast biopsy Left 05/19/2014    stereo biopsy (negative)  . Breast lumpectomy Right 06/05/2014    right breast lumpectomy -  radiation treatment  . Vaginal hysterectomy N/A 05/31/2015    Procedure: HYSTERECTOMY VAGINAL;  Surgeon: Benjaman Kindler, MD;  Location: [facility] ORS;  Service: Gynecology;  Laterality: N/A;  . Salpingoophorectomy Bilateral 05/31/2015    Procedure: SALPINGO OOPHORECTOMY;  Surgeon: Benjaman Kindler, MD;  Location: [facility] ORS;  Service: Gynecology;  Laterality: Bilateral;  . Cystoscopy  05/31/2015    Procedure: CYSTOSCOPY;  Surgeon: Benjaman Kindler, MD;  Location: [facility] ORS;  Service: Gynecology;;    There were no vitals filed for this visit.  Visit Diagnosis:  Myalgia and myositis  Lack of coordination  Decreased mobility          [facility] PT Assessment - 07/06/15 2305    Coordination   Gross Motor Movements are Fluid and Coordinated --  demo'd improved motor control with pelvic tilt in stance    AROM   Overall AROM Comments pelvic circles standing without pain compared to lasr session    Strength    Overall Strength Comments less pain with hip abd 4-/5    Palpation   Palpation comment only remaining tenderness lies with deep palpation along R lateral edge of sacrum , other areas showed decreased tensions and tenderness including perineal scar                   Pelvic Floor Special Questions - 07/06/15 2338    Pelvic Floor Internal Exam pt consented verbally withour contraindications, no deep palpation of pelvic floor was performed   Exam Type Vaginal   Biofeedback pt showed overuse of adductor and gluts with cue for stoping flow of urine, pt was able to demo pelvic floor ROM with cuing for breathing coordinaiton            [facility] Adult PT Treatment/Exercise - 07/06/15 2302    Self-Care   Other Self-Care Comments  heavy emphasis on not over doing it, to communicate with PT if pt were to surpass the amount of exercise recommended, explained the causes for her relapse and the improvement of her Sx with signs of centralization    Neuro Re-ed    Neuro Re-ed Details  deep core level 1-2, pelvic movements in standing, pelvic floor endurance training (see patient instruction)    Manual Therapy  PT Education - 07/06/15 2311    Education provided Yes   Education Details HEP, heavy emphasis on the neuroscience of pain, principles of centralization of pain, effect of overdoing exercises beyond recommendations and relapse of pain   Person(s) Educated Patient   Methods Explanation;Demonstration;Tactile cues;Verbal cues;Handout   Comprehension Verbalized understanding;Returned demonstration             PT Long Term Goals - 06/27/15 2048    PT LONG TERM GOAL #1   Title Pt will report decreased pain with sit to stand and be able to perform faster 5TST from 24.11 sec to < 15sec with proper deep core technique in order to perform ADLs.   Time 12   Period Weeks   Status New   PT LONG TERM GOAL #2   Title Pt will decrease her score on [place] from 43% to <  20% in order to return to ADLs and improve QOLs.   Time 12   Period Weeks   Status New   PT LONG TERM GOAL #3   Title Pt will demonstrate decreased thoracic mm tensions and increased thoracic segmental mobility in order to realign spine and pelvis for optimal deep core function for fitness routines.    Time 12   Period Weeks   Status New   PT LONG TERM GOAL #4   Title Pt will demo increased hip strength to 5/5 bilaterally and increased ROM hip ext to 20 deg bilaterally in order to progress towards return to walking.    Time 12   Period Weeks   Status New   PT LONG TERM GOAL #5   Title Pt demo increased gait speed from 0.9 m/s to 1.2 m/s in order to return to walking > 2 mi.    Time 12   Period Weeks   Status New               Plan - 07/06/15 2312    Clinical Impression Statement Pt was able to perform two days of exercises that were beyond recommendation by PT before experiencing relapse of pain. Pt continues to require biopsychosocial approaches as pt's relapse of pain was due to overexertion of exercises beyond PT recommendation which escalated to demonstration of catastrophization and frustration upon arriving to appt. Pt appeared less frustrated and in agreeement with POC at the end of appt. Her HEP was modified with decreased walking, omitted side step and mini squats (due to pt overoding it and c/o of sensations at anterior pelvic area). Pt would benefit from more relaxation techniques to minimize relapse of pain due to pt's personality. Pt's remaining pain is now centralized along R sacral edge with deep palpation and she showed improved mobility of pelvic girdle without report of pain. With decreased fascial restrictions along perineal scar, pt achieved pelvic floor ROM today. Pt was enabled her to progress to deep core  breathing coordination only. Withheld pelvic floor strengthening due to  overuse of adductor/ glut mm to activate pelvic floor muscles .  Plan to maintain short  durations of aerobic activity at low intensity and incorporate yoga.flexibility routine along with progressions of deep core exercises in gravity eliminated positions.  Plan to refer out if pt's symptoms do not improve after the 6th week.     Pt will benefit from skilled therapeutic intervention in order to improve on the following deficits Abnormal gait;Decreased endurance;Decreased coordination;Increased fascial restricitons;Hypomobility;Decreased cognition;Decreased balance;Decreased range of motion;Decreased mobility;Decreased activity tolerance;Impaired flexibility;Decreased knowledge of use of DME;Postural dysfunction;Difficulty walking;Decreased  strength;Increased muscle spasms;Improper body mechanics   Rehab Potential Good   Clinical Impairments Affecting Rehab Potential Pt is under precautious/ restrictions s/p hysterectomy.    PT Frequency 2x / week   PT Duration 12 weeks   PT Treatment/Interventions ADLs/Self Care Home Management;Aquatic Therapy;Cryotherapy;Electrical Stimulation;Moist Heat;Traction;Patient/family education;Neuromuscular re-education;Balance training;Therapeutic exercise;Therapeutic activities;Functional mobility training;Stair training;Gait training;Manual techniques;Scar mobilization;Dry needling;Energy conservation;Taping  biopsychosocial approach, relaxation training   Consulted and Agree with Plan of Care Patient        Problem List Patient Active Problem List   Diagnosis Date Noted  . Postoperative state 05/31/2015  . Abnormal uterine bleeding 03/09/2015  . Encounter for gynecological examination without abnormal finding 03/09/2015  . HLD (hyperlipidemia) 09/14/2014  . Anxiety 12/19/2013  . Clinical depression 12/19/2013    Jerl Mina ,PT, DPT, E-RYT  07/06/2015, 11:49 PM  [place] MAIN [facility] SERVICES [address] [place], [place], [address] Phone: [phone]   Fax:  [phone]  Name: Amanda Winters MRN: [id number] Date of Birth: January 14, 1966

## 2015-07-09 ENCOUNTER — Ambulatory Visit: Payer: 59 | Admitting: Physical Therapy

## 2015-07-09 DIAGNOSIS — IMO0001 Reserved for inherently not codable concepts without codable children: Secondary | ICD-10-CM

## 2015-07-09 DIAGNOSIS — R2689 Other abnormalities of gait and mobility: Secondary | ICD-10-CM

## 2015-07-09 DIAGNOSIS — R279 Unspecified lack of coordination: Secondary | ICD-10-CM

## 2015-07-09 DIAGNOSIS — M791 Myalgia: Secondary | ICD-10-CM | POA: Diagnosis not present

## 2015-07-09 NOTE — Therapy (Signed)
[place] MAIN [facility] SERVICES [address] [place], [place], [address] Phone: [phone]   Fax:  [phone]  Physical Therapy Treatment  Patient Details  Name: Amanda Winters MRN: [id number] Date of Birth: Jun 19, 1966 No Data Recorded  Encounter Date: 07/09/2015      PT End of Session - 07/09/15 2301    Visit Number 7   Number of Visits 12   PT Start Time 8115   PT Stop Time 1320   PT Time Calculation (min) 75 min   Activity Tolerance Patient tolerated treatment well;No increased pain   Behavior During Therapy [facility] for tasks assessed/performed      Past Medical History  Diagnosis Date  . Breast cancer ([place]) 2015    right breast cancer  . Asthma     no inhalers  . Anxiety   . Depression     Past Surgical History  Procedure Laterality Date  . Breast biopsy Right 05/05/2014    Stereo biopsy (positive)  . Breast biopsy Left 05/19/2014    stereo biopsy (negative)  . Breast lumpectomy Right 06/05/2014    right breast lumpectomy -  radiation treatment  . Vaginal hysterectomy N/A 05/31/2015    Procedure: HYSTERECTOMY VAGINAL;  Surgeon: Benjaman Kindler, MD;  Location: [facility] ORS;  Service: Gynecology;  Laterality: N/A;  . Salpingoophorectomy Bilateral 05/31/2015    Procedure: SALPINGO OOPHORECTOMY;  Surgeon: Benjaman Kindler, MD;  Location: [facility] ORS;  Service: Gynecology;  Laterality: Bilateral;  . Cystoscopy  05/31/2015    Procedure: CYSTOSCOPY;  Surgeon: Benjaman Kindler, MD;  Location: [facility] ORS;  Service: Gynecology;;    There were no vitals filed for this visit.  Visit Diagnosis:  Myalgia and myositis  Lack of coordination  Decreased mobility      Subjective Assessment - 07/09/15 1217    Subjective Pt stated she was able to sleep through the night for the first time two days after last session. Pt reported no radiating pain down R LE has bee resolved for the past 4 days since last session. Pt was able to downgrade her exercsies  to walking on treadmill as recommended. She stated she had no pain during nor after walking, only soreness at R inner thigh. Pt's  only complaint today is soreness and tenderness to touch at R inner thigh which her husband has been massaging to decrease discomfort.  Pt is able to sleep on her back mnore often but does end up on her R side managable discomfort. Pt sleeps with a heating pad despite PT's recommendation not to. Pt expressed she would like to not rely on the heating pad.    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), Prior to suregery, pt 's exercise routine:  walk 30 min, weight training 10#, dumbbells, sit-ups 200 reps, Hx of 1 vaginal delivery- 4th degree tear with stitches, s/p 2 weeks hysterectomy with restrictions no lifting 10 lbs, no vaccuuming, heavy lifting,    Patient Stated Goals reduce pain by 50% at a 4/10 instead 8/10, better ways for fitness and build core mm safely, return to running    Pain Onset 1 to 4 weeks ago            [facility] PT Assessment - 07/09/15 1233    PROM   Overall PROM Comments pre-Tx/ post-Tx: R hip IR : ~10 deg/ ~25 deg, L deg ~30 deg,  Strength   Overall Strength Comments  R hip 4-/5 w/ no pain, only soreness    Palpation   SI assessment  decreased parapsinal mm,    Palpation comment no tensions around obturator  rami, only fascial restriction around R medial thigh   Post-Tx: decreased fascial restrictions/ tenderness   Bed Mobility   Bed Mobility --  sit <> supine log rolling w/o cuing                     [facility] Adult PT Treatment/Exercise - 07/09/15 1233    Ambulation/Gait   Gait Comments walking with longer stride length    posterior lean, hyperextension of knees in stance   Neuro Re-ed    Neuro Re-ed Details  cued for more COM over toes with gait, decrease hyperext of knees in stance   deep core 3 but downgraded to heel  raise due to instability    Exercises   Other Exercises  Happy Baby Pose with strap, restorative pose with props and heat over thighs (skin intact after removal)    Moist Heat Therapy   Number Minutes Moist Heat 10 Minutes   Moist Heat Location Other (comment)  R thigh   Manual Therapy   Myofascial Release Edge Tool and gliding along medioposterior thigh R                 PT Education - 07/09/15 2301    Education provided Yes   Education Details HEP   Person(s) Educated Patient   Methods Explanation;Demonstration;Tactile cues;Verbal cues;Handout   Comprehension Verbalized understanding;Returned demonstration             PT Long Term Goals - 07/09/15 1406    PT LONG TERM GOAL #1   Title Pt will report decreased pain with sit to stand and be able to perform faster 5TST from 24.11 sec to < 15sec with proper deep core technique in order to perform ADLs.   Time 12   Period Weeks   Status New   PT LONG TERM GOAL #2   Title Pt will decrease her score on [place] from 43% to < 20% in order to return to ADLs and improve QOLs.   Time 12   Period Weeks   Status Partially Met   PT LONG TERM GOAL #3   Title Pt will demonstrate decreased thoracic mm tensions and increased thoracic segmental mobility in order to realign spine and pelvis for optimal deep core function for fitness routines.    Time 12   Period Weeks   Status Achieved   PT LONG TERM GOAL #4   Title Pt will demo increased hip strength to 5/5 bilaterally and increased ROM hip ext to 20 deg bilaterally in order to progress towards return to walking.    Time 12   Period Weeks   Status Partially Met   PT LONG TERM GOAL #5   Title Pt demo increased gait speed from 0.9 m/s to 1.2 m/s in order to return to walking > 2 mi.    Time 12   Period Weeks   Status New   Additional Long Term Goals   Additional Long Term Goals Yes   PT LONG TERM GOAL #6   Title Pt will demo equal PROM hip IR ~30 deg in order to progress to  pelvic floor strengthening.    Time 12   Period Weeks   Status New  Plan - 07/09/15 1320    Clinical Impression Statement Pt is progressing well with no more radiating leg pain across the past 4 days. Her residue deficit is restricted fascia along her inner thigh. Pt tolerated manual Tx and demo'd increased PROM hip IR post-Tx.  Added one more stretch and restorative pose into HEP to build on flexibility and stress management. Plan to address pelvic floor movement and strength next week as pt will have apporached 6 weeks post surgery.  Anticipated pt will progress towards goals.     Pt will benefit from skilled therapeutic intervention in order to improve on the following deficits Abnormal gait;Decreased endurance;Decreased coordination;Increased fascial restricitons;Hypomobility;Decreased cognition;Decreased balance;Decreased range of motion;Decreased mobility;Decreased activity tolerance;Impaired flexibility;Decreased knowledge of use of DME;Postural dysfunction;Difficulty walking;Decreased strength;Increased muscle spasms;Improper body mechanics   Rehab Potential Good   Clinical Impairments Affecting Rehab Potential Pt is under precautious/ restrictions s/p hysterectomy.    PT Frequency 2x / week   PT Duration 12 weeks   PT Treatment/Interventions ADLs/Self Care Home Management;Aquatic Therapy;Cryotherapy;Electrical Stimulation;Moist Heat;Traction;Patient/family education;Neuromuscular re-education;Balance training;Therapeutic exercise;Therapeutic activities;Functional mobility training;Stair training;Gait training;Manual techniques;Scar mobilization;Dry needling;Energy conservation;Taping  biopsychosocial approach, relaxation training   Consulted and Agree with Plan of Care Patient        Problem List Patient Active Problem List   Diagnosis Date Noted  . Postoperative state 05/31/2015  . Abnormal uterine bleeding 03/09/2015  . Encounter for gynecological examination  without abnormal finding 03/09/2015  . HLD (hyperlipidemia) 09/14/2014  . Anxiety 12/19/2013  . Clinical depression 12/19/2013    Jerl Mina ,PT, DPT, E-RYT  07/09/2015, 11:04 PM  [place] MAIN [facility] SERVICES [address] [place], [place], [address] Phone: [phone]   Fax:  [phone]  Name: BRINLYN CENA MRN: [id number] Date of Birth: 02/03/1966

## 2015-07-09 NOTE — Patient Instructions (Addendum)
Deep core level 3, 10 x 3 but only raise heels   Happy baby pose   Restorative pose (legs propped)   Walking 30 min this weekend but not over do it.

## 2015-07-12 ENCOUNTER — Encounter: Payer: 59 | Admitting: Physical Therapy

## 2015-07-14 ENCOUNTER — Ambulatory Visit: Payer: 59 | Admitting: Physical Therapy

## 2015-07-14 DIAGNOSIS — R279 Unspecified lack of coordination: Secondary | ICD-10-CM

## 2015-07-14 DIAGNOSIS — IMO0001 Reserved for inherently not codable concepts without codable children: Secondary | ICD-10-CM

## 2015-07-14 DIAGNOSIS — R2689 Other abnormalities of gait and mobility: Secondary | ICD-10-CM

## 2015-07-14 DIAGNOSIS — M791 Myalgia: Secondary | ICD-10-CM | POA: Diagnosis not present

## 2015-07-14 NOTE — Patient Instructions (Signed)
Withhold "w" band exercises and deep core 1-2  Perform deep core 3   Childs pose on pillows as a restorative pose with pelvic floor relaxation practice

## 2015-07-14 NOTE — Therapy (Signed)
[place] MAIN [facility] SERVICES [address] [place], [place], [address] Phone: [phone]   Fax:  [phone]  Physical Therapy Treatment  Patient Details  Name: Amanda Winters MRN: [id number] Date of Birth: 1965-11-22 No Data Recorded  Encounter Date: 07/14/2015      PT End of Session - 07/14/15 2327    Visit Number 8   Number of Visits 12   PT Start Time 1200   PT Stop Time 4536   PT Time Calculation (min) 65 min   Activity Tolerance Patient tolerated treatment well;No increased pain   Behavior During Therapy [facility] for tasks assessed/performed      Past Medical History  Diagnosis Date  . Breast cancer ([place]) 2015    right breast cancer  . Asthma     no inhalers  . Anxiety   . Depression     Past Surgical History  Procedure Laterality Date  . Breast biopsy Right 05/05/2014    Stereo biopsy (positive)  . Breast biopsy Left 05/19/2014    stereo biopsy (negative)  . Breast lumpectomy Right 06/05/2014    right breast lumpectomy -  radiation treatment  . Vaginal hysterectomy N/A 05/31/2015    Procedure: HYSTERECTOMY VAGINAL;  Surgeon: Benjaman Kindler, MD;  Location: [facility] ORS;  Service: Gynecology;  Laterality: N/A;  . Salpingoophorectomy Bilateral 05/31/2015    Procedure: SALPINGO OOPHORECTOMY;  Surgeon: Benjaman Kindler, MD;  Location: [facility] ORS;  Service: Gynecology;  Laterality: Bilateral;  . Cystoscopy  05/31/2015    Procedure: CYSTOSCOPY;  Surgeon: Benjaman Kindler, MD;  Location: [facility] ORS;  Service: Gynecology;;    There were no vitals filed for this visit.  Visit Diagnosis:  Myalgia and myositis  Lack of coordination  Decreased mobility      Subjective Assessment - 07/14/15 1203    Subjective Pt has not had radiating pain for the past 1.5 -2 weeks. Pt's SIJ pain has been absent for the past week except for last night but its duration was short-lasting. Pt walked around her block on Saturday and on Monday, she walked the  treadmill for 20 min. On Tues, pt walked outside for 20 min.  Pt now  feels throbbing,achey, heavy sensation in her R thigh to above the knee (5/10) especially with walking . This cluster of sensations were constantly present prior to surgery at (2/10) but it has intensified since surgery.    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), Prior to suregery, pt 's exercise routine:  walk 30 min, weight training 10#, dumbbells, sit-ups 200 reps, Hx of 1 vaginal delivery- 4th degree tear with stitches, s/p 2 weeks hysterectomy with restrictions no lifting 10 lbs, no vaccuuming, heavy lifting,    Patient Stated Goals reduce pain by 50% at a 4/10 instead 8/10, better ways for fitness and build core mm safely, return to running    Pain Onset 1 to 4 weeks ago            [facility] PT Assessment - 07/14/15 2326    Observation/Other Assessments   Observations pt reported heaviness feeling with hooklying pre-tx, no heaviness post-Tx   Ambulation/Gait   Gait Comments increased pelvic rotation/sway, trunk rotation, COM over tibia   increased stride length                   Pelvic Floor Special Questions - 07/14/15 2325  Pelvic Floor Internal Exam pt consented verbally withour contraindications, pt at 6 weeks s/p hysterectomy    Exam Type Vaginal   Palpation severe tenderness at R pubococcygeus, ishiococcygeus , obt int, and L obt int            [facility] Adult PT Treatment/Exercise - 07/14/15 2321    Self-Care   Other Self-Care Comments  metaphors for perceiving pain, pt demo'd undestanding   Neuro Re-ed    Neuro Re-ed Details  coordinating pelvic floor excursion laterally to relax pelvic floor    Exercises   Other Exercises  condensed HEP, added childspose with pillows (restorative pose) , progressed to deep core 3 full lift of feet.    Manual Therapy   Soft tissue mobilization  Externally: R coccygeus lateral to coccyx   Myofascial Release --   Internal Pelvic Floor thiele massage bialterally, MET on R ischiococcygeus, pubococcygeus, obt int, and L obt int                 PT Education - 07/14/15 2324    Education provided Yes   Education Details HEP, metaphors related to her pain, correcting perseveration of pain with positive self talk    Person(s) Educated Patient   Methods Explanation;Demonstration;Tactile cues;Verbal cues;Handout             PT Long Term Goals - 07/09/15 1406    PT LONG TERM GOAL #1   Title Pt will report decreased pain with sit to stand and be able to perform faster 5TST from 24.11 sec to < 15sec with proper deep core technique in order to perform ADLs.   Time 12   Period Weeks   Status New   PT LONG TERM GOAL #2   Title Pt will decrease her score on [place] from 43% to < 20% in order to return to ADLs and improve QOLs.   Time 12   Period Weeks   Status Partially Met   PT LONG TERM GOAL #3   Title Pt will demonstrate decreased thoracic mm tensions and increased thoracic segmental mobility in order to realign spine and pelvis for optimal deep core function for fitness routines.    Time 12   Period Weeks   Status Achieved   PT LONG TERM GOAL #4   Title Pt will demo increased hip strength to 5/5 bilaterally and increased ROM hip ext to 20 deg bilaterally in order to progress towards return to walking.    Time 12   Period Weeks   Status Partially Met   PT LONG TERM GOAL #5   Title Pt demo increased gait speed from 0.9 m/s to 1.2 m/s in order to return to walking > 2 mi.    Time 12   Period Weeks   Status New   Additional Long Term Goals   Additional Long Term Goals Yes   PT LONG TERM GOAL #6   Title Pt will demo equal PROM hip IR ~30 deg in order to progress to pelvic floor strengthening.    Time 12   Period Weeks   Status New               Plan - 07/14/15 2328    Clinical Impression Statement Pt continues  to not have any radicular Sx the past week.  Pt has been able to  walk for 20 min but has diffuse pain that is described as "throbbing, achey, and heavy" in her R thigh. After receiving intravaginal manual releases on  tight pelvic floor mm R> L, pt reported her R thigh felt "spongey". The"throbbing and achiness" sensation remained but "heaviness" was resolved. Pt also required external releases lateral to coccyx on R. Pt demo'd good carry over of  gait mechanics with more pelvic and trunk rotations along with longer stride length. Anticipate pt's residue complaints of throbbing and achiness will improve as long as pt is able to continue relaxing pelvic floor mm.  Plan to reassess hip IR PROM in supine at next session.  Pt continues to require skilled Pelvic Health PT.     Pt will benefit from skilled therapeutic intervention in order to improve on the following deficits Abnormal gait;Decreased endurance;Decreased coordination;Increased fascial restricitons;Hypomobility;Decreased cognition;Decreased balance;Decreased range of motion;Decreased mobility;Decreased activity tolerance;Impaired flexibility;Decreased knowledge of use of DME;Postural dysfunction;Difficulty walking;Decreased strength;Increased muscle spasms;Improper body mechanics   Rehab Potential Good   Clinical Impairments Affecting Rehab Potential Pt is under precautious/ restrictions s/p hysterectomy.    PT Frequency 2x / week   PT Duration 12 weeks   PT Treatment/Interventions ADLs/Self Care Home Management;Aquatic Therapy;Cryotherapy;Electrical Stimulation;Moist Heat;Traction;Patient/family education;Neuromuscular re-education;Balance training;Therapeutic exercise;Therapeutic activities;Functional mobility training;Stair training;Gait training;Manual techniques;Scar mobilization;Dry needling;Energy conservation;Taping  biopsychosocial approach, relaxation training   Consulted and Agree with Plan of Care Patient        Problem  List Patient Active Problem List   Diagnosis Date Noted  . Postoperative state 05/31/2015  . Abnormal uterine bleeding 03/09/2015  . Encounter for gynecological examination without abnormal finding 03/09/2015  . HLD (hyperlipidemia) 09/14/2014  . Anxiety 12/19/2013  . Clinical depression 12/19/2013    Sara Chu, DPT, E-RYT  07/14/2015, 11:43 PM  [place] MAIN [facility] SERVICES [address] [place], [place], [address] Phone: [phone]   Fax:  [phone]  Name: CHAREESE SERGENT MRN: [id number] Date of Birth: 19-Apr-1966

## 2015-07-16 ENCOUNTER — Encounter: Payer: 59 | Admitting: Physical Therapy

## 2015-07-16 ENCOUNTER — Ambulatory Visit: Payer: 59 | Admitting: Physical Therapy

## 2015-07-23 ENCOUNTER — Ambulatory Visit: Payer: 59 | Admitting: Physical Therapy

## 2015-07-30 ENCOUNTER — Ambulatory Visit: Payer: 59 | Admitting: Physical Therapy

## 2015-08-06 ENCOUNTER — Encounter: Payer: 59 | Admitting: Physical Therapy

## 2015-08-13 ENCOUNTER — Encounter: Payer: 59 | Admitting: Physical Therapy

## 2015-09-02 ENCOUNTER — Other Ambulatory Visit: Payer: Self-pay | Admitting: Physical Medicine and Rehabilitation

## 2015-09-02 DIAGNOSIS — M5416 Radiculopathy, lumbar region: Secondary | ICD-10-CM

## 2015-09-08 ENCOUNTER — Ambulatory Visit
Admission: RE | Admit: 2015-09-08 | Discharge: 2015-09-08 | Disposition: A | Discharge status: Home / Self Care | Payer: 59 | Source: Ambulatory Visit | Attending: Physical Medicine and Rehabilitation | Admitting: Physical Medicine and Rehabilitation

## 2015-09-08 DIAGNOSIS — M5416 Radiculopathy, lumbar region: Secondary | ICD-10-CM | POA: Insufficient documentation

## 2015-09-08 DIAGNOSIS — Q057 Lumbar spina bifida without hydrocephalus: Secondary | ICD-10-CM | POA: Diagnosis not present

## 2015-09-08 DIAGNOSIS — M5127 Other intervertebral disc displacement, lumbosacral region: Secondary | ICD-10-CM | POA: Diagnosis not present

## 2015-09-08 DIAGNOSIS — M5136 Other intervertebral disc degeneration, lumbar region: Secondary | ICD-10-CM | POA: Diagnosis not present

## 2016-03-14 ENCOUNTER — Other Ambulatory Visit: Payer: Self-pay | Admitting: Oncology

## 2016-03-14 ENCOUNTER — Other Ambulatory Visit: Payer: Self-pay | Admitting: Obstetrics and Gynecology

## 2016-03-14 DIAGNOSIS — N6310 Unspecified lump in the right breast, unspecified quadrant: Secondary | ICD-10-CM

## 2016-03-28 ENCOUNTER — Ambulatory Visit
Admission: RE | Admit: 2016-03-28 | Discharge: 2016-03-28 | Disposition: A | Discharge status: Home / Self Care | Payer: 59 | Source: Ambulatory Visit | Attending: Obstetrics and Gynecology | Admitting: Obstetrics and Gynecology

## 2016-03-28 DIAGNOSIS — Z9889 Other specified postprocedural states: Secondary | ICD-10-CM | POA: Diagnosis not present

## 2016-03-28 DIAGNOSIS — N644 Mastodynia: Secondary | ICD-10-CM | POA: Diagnosis not present

## 2016-03-28 DIAGNOSIS — N6310 Unspecified lump in the right breast, unspecified quadrant: Secondary | ICD-10-CM | POA: Diagnosis present

## 2016-03-28 HISTORY — DX: Personal history of irradiation: Z92.3

## 2016-05-10 ENCOUNTER — Other Ambulatory Visit: Payer: 59

## 2016-05-10 ENCOUNTER — Ambulatory Visit: Payer: 59 | Admitting: Oncology

## 2016-05-10 ENCOUNTER — Ambulatory Visit: Payer: 59

## 2016-05-15 ENCOUNTER — Ambulatory Visit: Payer: 59 | Admitting: Family Medicine

## 2016-05-15 ENCOUNTER — Inpatient Hospital Stay: Payer: 59

## 2016-05-15 ENCOUNTER — Ambulatory Visit: Payer: 59 | Admitting: Oncology

## 2016-05-16 ENCOUNTER — Ambulatory Visit: Payer: 59

## 2016-05-16 ENCOUNTER — Other Ambulatory Visit: Payer: 59

## 2016-05-19 ENCOUNTER — Inpatient Hospital Stay: Payer: 59

## 2016-05-25 ENCOUNTER — Ambulatory Visit: Payer: 59 | Attending: Radiation Oncology | Admitting: Radiation Oncology

## 2017-04-03 ENCOUNTER — Other Ambulatory Visit: Payer: Self-pay | Admitting: Family Medicine

## 2017-04-03 DIAGNOSIS — Z853 Personal history of malignant neoplasm of breast: Secondary | ICD-10-CM

## 2017-04-05 IMAGING — MG MM DIAG BREAST TOMO BILATERAL
8 of 18 series · 8 of 40 positions shown · non-contrast
Comparison: Previous exam(s).

CLINICAL DATA: 6639 lumpectomy inferior right breast, history of
benign stereotactic biopsy left calcifications [REDACTED] o'clock
position

EXAM:
DIGITAL DIAGNOSTIC BILATERAL MAMMOGRAM WITH 3D TOMOSYNTHESIS AND CAD

[R MLO (1 of 2)]
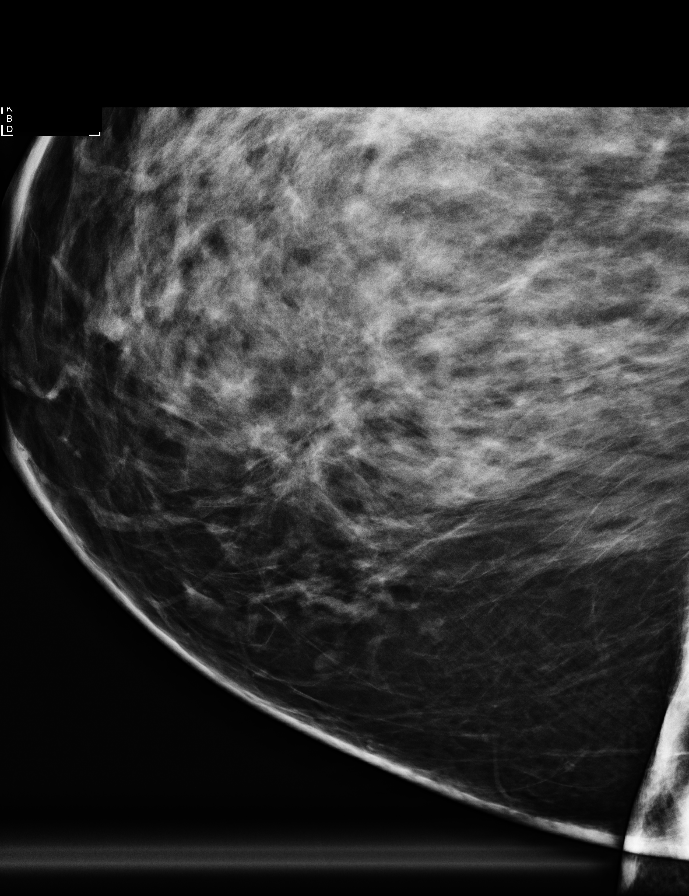

[L CC synth-2D]
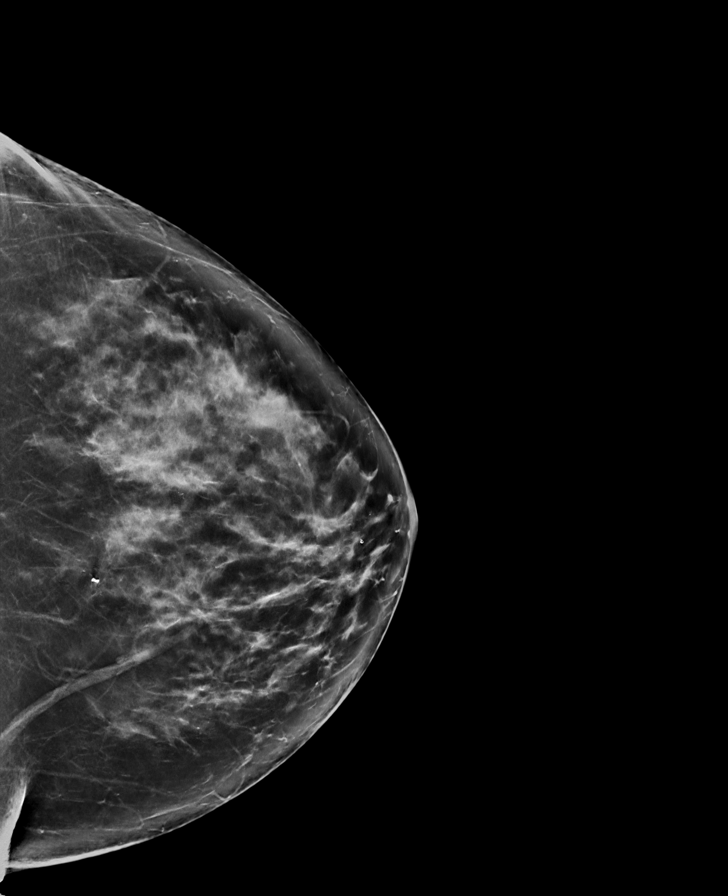

[L MLO]
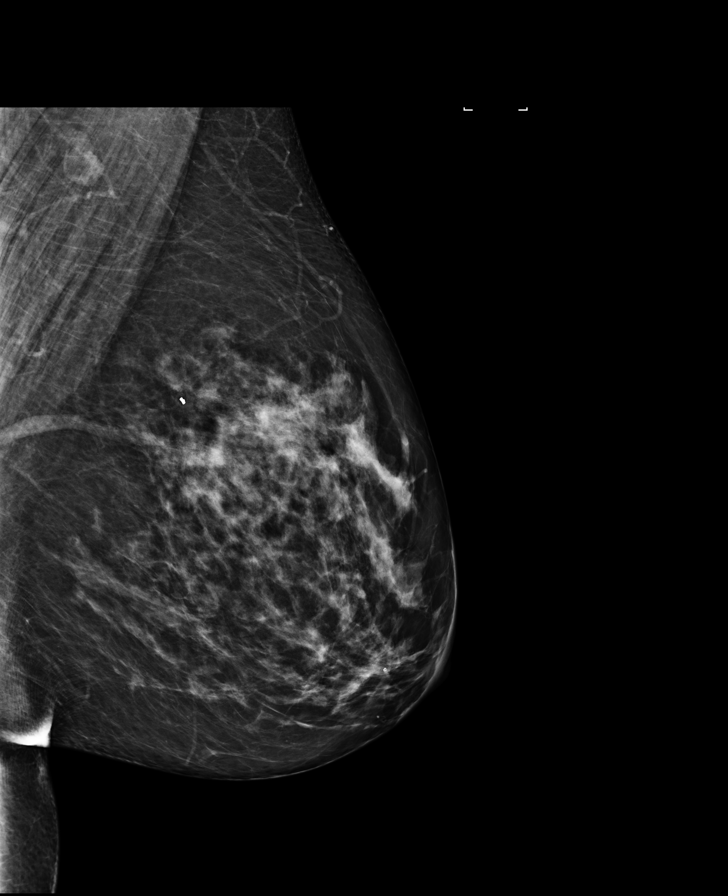

[L CC (1 of 2)]
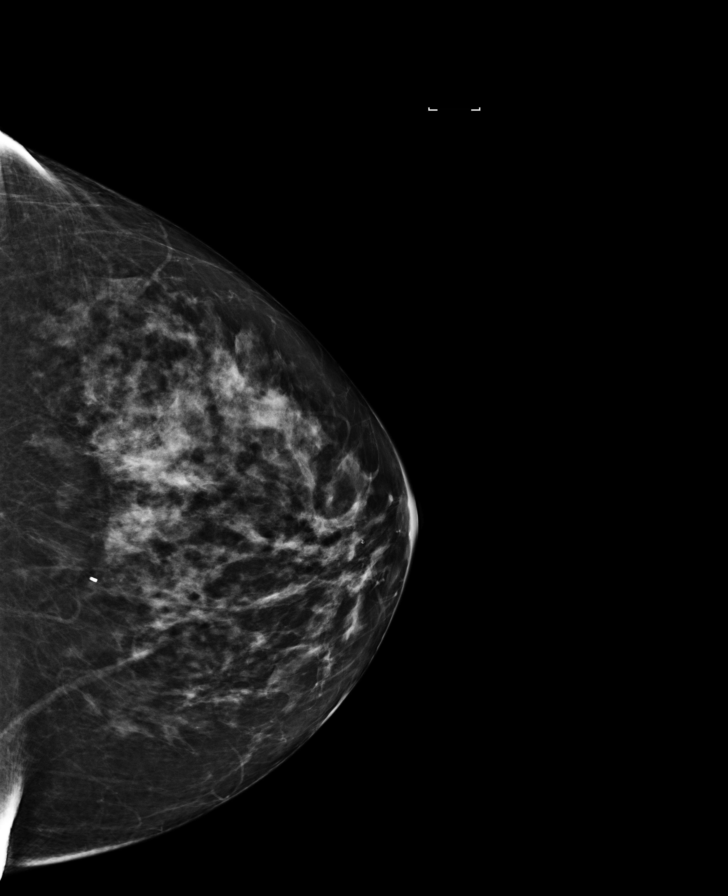

[R MLO synth-2D]
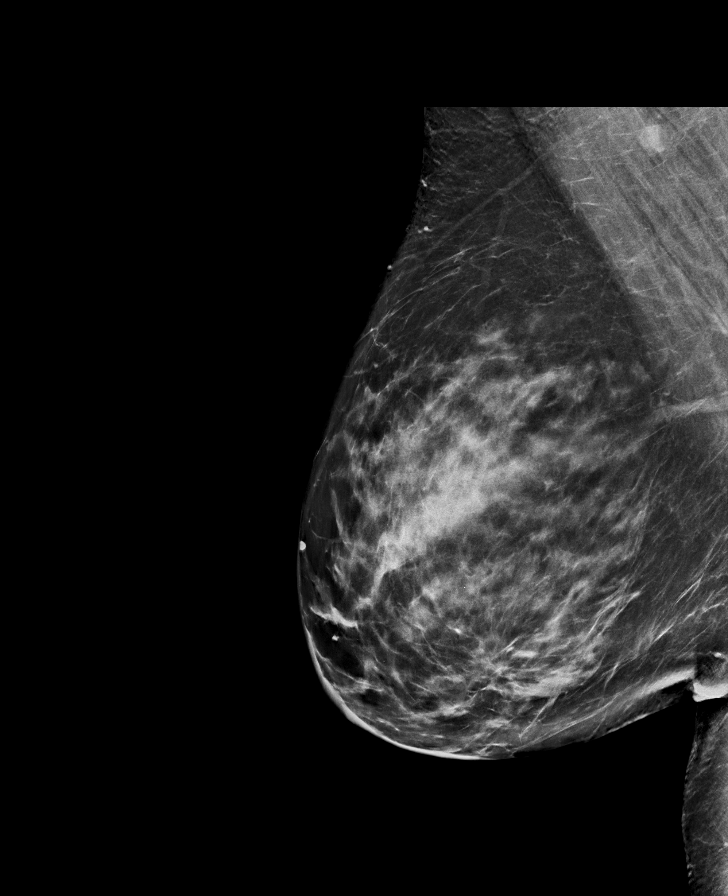

[R MLO (2 of 2)]
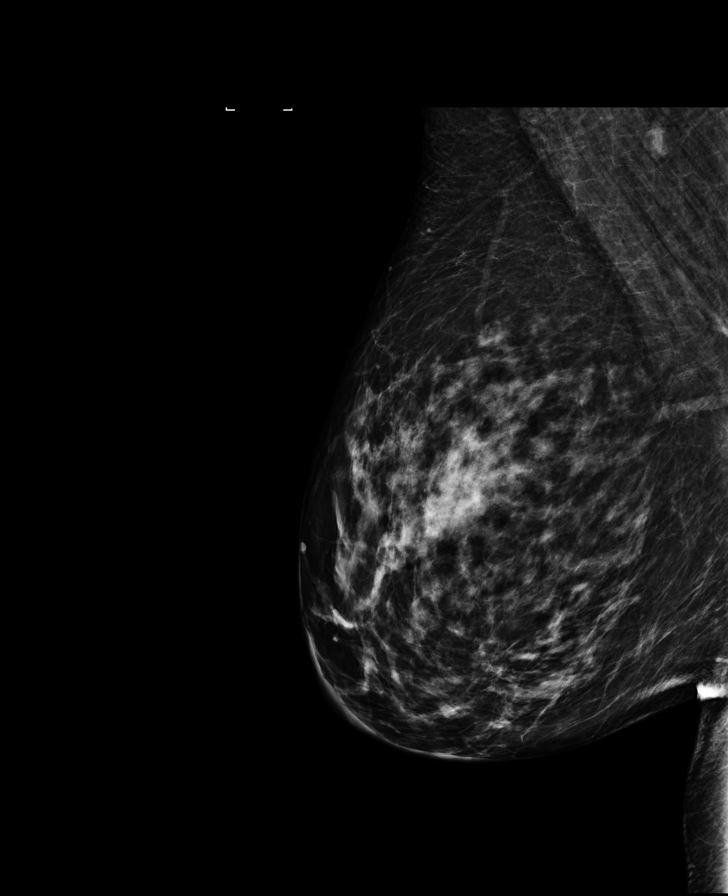

[L ML]
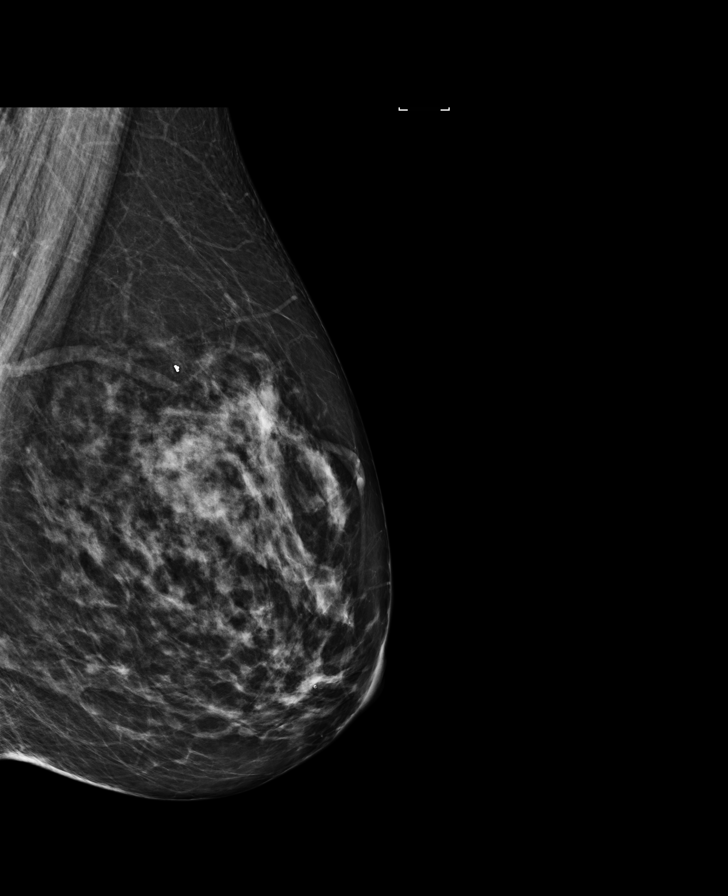

[L CC (2 of 2)]
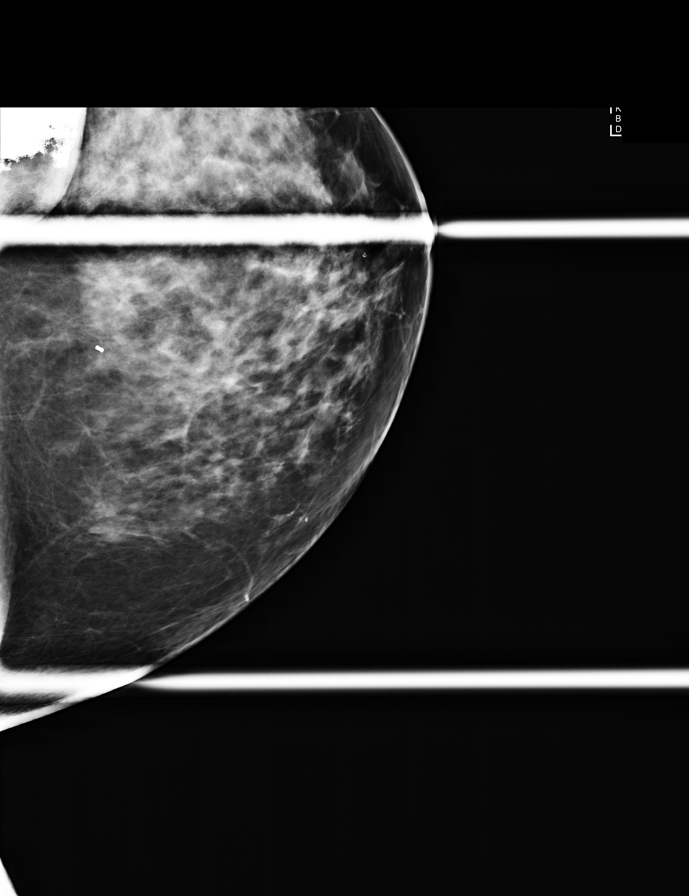

[8 of 40 positions shown; findings below may reference images not displayed]

ACR Breast Density Category c: The breast tissue is heterogeneously
dense, which may obscure small masses.
FINDINGS: Postsurgical scar lower outer quadrant right breast. No suspicious
associated findings.

No suspicious findings on the left. An area of perceived distortion
in the medial left breast did not persist on spot compression or
true lateral views performed with tomography.

Mammographic images were processed with CAD.
IMPRESSION: Benign postoperative appearing

RECOMMENDATION:
Diagnostic bilateral mammogram in 1 year

I have discussed the findings and recommendations with the patient.
Results were also provided in writing at the conclusion of the
visit. If applicable, a reminder letter will be sent to the patient
regarding the next appointment.

BI-RADS CATEGORY  2: Benign.

## 2017-04-27 ENCOUNTER — Ambulatory Visit
Admission: RE | Admit: 2017-04-27 | Discharge: 2017-04-27 | Disposition: A | Discharge status: Home / Self Care | Payer: 59 | Source: Ambulatory Visit | Attending: Family Medicine | Admitting: Family Medicine

## 2017-04-27 DIAGNOSIS — Z853 Personal history of malignant neoplasm of breast: Secondary | ICD-10-CM | POA: Diagnosis present

## 2017-04-27 DIAGNOSIS — Z9889 Other specified postprocedural states: Secondary | ICD-10-CM | POA: Insufficient documentation

## 2017-09-21 ENCOUNTER — Other Ambulatory Visit: Payer: Self-pay

## 2017-09-21 ENCOUNTER — Telehealth: Payer: Self-pay

## 2017-09-21 DIAGNOSIS — Z1211 Encounter for screening for malignant neoplasm of colon: Secondary | ICD-10-CM

## 2017-09-21 NOTE — Telephone Encounter (Signed)
Gastroenterology Pre-Procedure Review  Request Date:  Requesting Physician: Dr.   PATIENT REVIEW QUESTIONS: The patient responded to the following health history questions as indicated:    1. Are you having any GI issues? no 2. Do you have a personal history of Polyps? no 3. Do you have a family history of Colon Cancer or Polyps? yes (Mother, polyps) 4. Diabetes Mellitus? no 5. Joint replacements in the past 12 months?no 6. Major health problems in the past 3 months?no 7. Any artificial heart valves, MVP, or defibrillator?no    MEDICATIONS & ALLERGIES:    Patient reports the following regarding taking any anticoagulation/antiplatelet therapy:   Plavix, Coumadin, Eliquis, Xarelto, Lovenox, Pradaxa, Brilinta, or Effient? no Aspirin? no  Patient confirms/reports the following medications:  Current Outpatient Medications  Medication Sig Dispense Refill  . buPROPion (WELLBUTRIN XL) 150 MG 24 hr tablet Take 150 mg by mouth daily.    . calcium carbonate 1250 MG capsule Take 1,250 mg by mouth 2 (two) times daily with a meal.    . cholecalciferol (VITAMIN D) 1000 units tablet Take 1,000 Units by mouth daily.    . diphenhydrAMINE (BENADRYL) 25 mg capsule Take by mouth. Reported on 07/14/2015    . EVENING PRIMROSE OIL PO Take by mouth.    Marland Kitchen ibuprofen (ADVIL,MOTRIN) 600 MG tablet Take 1 tablet (600 mg total) by mouth every 6 (six) hours as needed (mild pain). (Patient not taking: Reported on 07/14/2015) 30 tablet 0  . Multiple Vitamins-Minerals (CENTRUM SILVER PO) Take by mouth.     No current facility-administered medications for this visit.     Patient confirms/reports the following allergies:  No Known Allergies  No orders of the defined types were placed in this encounter.   AUTHORIZATION INFORMATION Primary Insurance: 1D#: Group #:  Secondary Insurance: 1D#: Group #:  SCHEDULE INFORMATION: Date: 10/26/17 Time: Location: [place]

## 2017-10-24 NOTE — Discharge Instructions (Signed)
General Anesthesia, Adult, Care After °These instructions provide you with information about caring for yourself after your procedure. Your health care provider may also give you more specific instructions. Your treatment has been planned according to current medical practices, but problems sometimes occur. Call your health care provider if you have any problems or questions after your procedure. °What can I expect after the procedure? °After the procedure, it is common to have: °· Vomiting. °· A sore throat. °· Mental slowness. ° °It is common to feel: °· Nauseous. °· Cold or shivery. °· Sleepy. °· Tired. °· Sore or achy, even in parts of your body where you did not have surgery. ° °Follow these instructions at home: °For at least 24 hours after the procedure: °· Do not: °? Participate in activities where you could fall or become injured. °? Drive. °? Use heavy machinery. °? Drink alcohol. °? Take sleeping pills or medicines that cause drowsiness. °? Make important decisions or sign legal documents. °? Take care of children on your own. °· Rest. °Eating and drinking °· If you vomit, drink water, juice, or soup when you can drink without vomiting. °· Drink enough fluid to keep your urine clear or pale yellow. °· Make sure you have little or no nausea before eating solid foods. °· Follow the diet recommended by your health care provider. °General instructions °· Have a responsible adult stay with you until you are awake and alert. °· Return to your normal activities as told by your health care provider. Ask your health care provider what activities are safe for you. °· Take over-the-counter and prescription medicines only as told by your health care provider. °· If you smoke, do not smoke without supervision. °· Keep all follow-up visits as told by your health care provider. This is important. °Contact a health care provider if: °· You continue to have nausea or vomiting at home, and medicines are not helpful. °· You  cannot drink fluids or start eating again. °· You cannot urinate after 8-12 hours. °· You develop a skin rash. °· You have fever. °· You have increasing redness at the site of your procedure. °Get help right away if: °· You have difficulty breathing. °· You have chest pain. °· You have unexpected bleeding. °· You feel that you are having a life-threatening or urgent problem. °This information is not intended to replace advice given to you by your health care provider. Make sure you discuss any questions you have with your health care provider. °Document Released: 09/11/2000 Document Revised: 11/08/2015 Document Reviewed: 05/20/2015 °Elsevier Interactive Patient Education © 2018 Elsevier Inc. ° °

## 2017-10-26 ENCOUNTER — Ambulatory Visit: Payer: Managed Care, Other (non HMO) | Admitting: Anesthesiology

## 2017-10-26 ENCOUNTER — Ambulatory Visit
Admission: RE | Admit: 2017-10-26 | Discharge: 2017-10-26 | Disposition: A | Discharge status: Home / Self Care | Payer: Managed Care, Other (non HMO) | Source: Ambulatory Visit | Attending: Gastroenterology | Admitting: Gastroenterology

## 2017-10-26 ENCOUNTER — Encounter
Admission: RE | Disposition: A | Discharge status: Home / Self Care | Payer: Self-pay | Source: Ambulatory Visit | Attending: Gastroenterology

## 2017-10-26 DIAGNOSIS — F329 Major depressive disorder, single episode, unspecified: Secondary | ICD-10-CM | POA: Insufficient documentation

## 2017-10-26 DIAGNOSIS — Z79899 Other long term (current) drug therapy: Secondary | ICD-10-CM | POA: Insufficient documentation

## 2017-10-26 DIAGNOSIS — Z87891 Personal history of nicotine dependence: Secondary | ICD-10-CM | POA: Insufficient documentation

## 2017-10-26 DIAGNOSIS — Z1211 Encounter for screening for malignant neoplasm of colon: Secondary | ICD-10-CM | POA: Diagnosis present

## 2017-10-26 DIAGNOSIS — K640 First degree hemorrhoids: Secondary | ICD-10-CM | POA: Diagnosis not present

## 2017-10-26 DIAGNOSIS — Z923 Personal history of irradiation: Secondary | ICD-10-CM | POA: Diagnosis not present

## 2017-10-26 DIAGNOSIS — F419 Anxiety disorder, unspecified: Secondary | ICD-10-CM | POA: Diagnosis not present

## 2017-10-26 DIAGNOSIS — Z853 Personal history of malignant neoplasm of breast: Secondary | ICD-10-CM | POA: Insufficient documentation

## 2017-10-26 DIAGNOSIS — J449 Chronic obstructive pulmonary disease, unspecified: Secondary | ICD-10-CM | POA: Insufficient documentation

## 2017-10-26 HISTORY — PX: COLONOSCOPY WITH PROPOFOL: SHX5780

## 2017-10-26 HISTORY — DX: Myoneural disorder, unspecified: G70.9

## 2017-10-26 HISTORY — DX: Chronic obstructive pulmonary disease, unspecified: J44.9

## 2017-10-26 SURGERY — COLONOSCOPY WITH PROPOFOL
Anesthesia: General | Site: Rectum | Wound class: Contaminated

## 2017-10-26 MED ORDER — LIDOCAINE HCL (CARDIAC) PF 100 MG/5ML IV SOSY
PREFILLED_SYRINGE | INTRAVENOUS | Status: DC | PRN
  Administered 2017-10-26: 20 mg via INTRAVENOUS

## 2017-10-26 MED ORDER — LACTATED RINGERS IV SOLN
INTRAVENOUS | Status: DC
  Administered 2017-10-26 (×2): via INTRAVENOUS

## 2017-10-26 MED ORDER — PROPOFOL 10 MG/ML IV BOLUS
INTRAVENOUS | Status: DC | PRN
  Administered 2017-10-26: 20 mg via INTRAVENOUS
  Administered 2017-10-26: 30 mg via INTRAVENOUS
  Administered 2017-10-26: 70 mg via INTRAVENOUS
  Administered 2017-10-26 (×3): 30 mg via INTRAVENOUS
  Administered 2017-10-26: 20 mg via INTRAVENOUS

## 2017-10-26 MED ORDER — SIMETHICONE 40 MG/0.6ML PO SUSP
ORAL | Status: DC | PRN
  Administered 2017-10-26: .5 mL

## 2017-10-26 MED ORDER — ACETAMINOPHEN 325 MG PO TABS: 325.0000 mg | ORAL_TABLET | ORAL | Status: DC | PRN

## 2017-10-26 MED ORDER — SODIUM CHLORIDE 0.9 % IV SOLN: INTRAVENOUS | Status: DC

## 2017-10-26 MED ORDER — ACETAMINOPHEN 160 MG/5ML PO SOLN: 325.0000 mg | ORAL | Status: DC | PRN

## 2017-10-26 SURGICAL SUPPLY — 5 items
CANISTER SUCT 1200ML W/VALVE (MISCELLANEOUS) ×2 IMPLANT
GOWN CVR UNV OPN BCK APRN NK (MISCELLANEOUS) ×2 IMPLANT
GOWN ISOL THUMB LOOP REG UNIV (MISCELLANEOUS) ×2
KIT ENDO PROCEDURE OLY (KITS) ×2 IMPLANT
WATER STERILE IRR 250ML POUR (IV SOLUTION) ×2 IMPLANT

## 2017-10-26 NOTE — Transfer of Care (Signed)
Immediate Anesthesia Transfer of Care Note  Patient: Amanda Winters  Procedure(s) Performed: COLONOSCOPY WITH PROPOFOL (N/A Rectum)  Patient Location: PACU  Anesthesia Type: General  Level of Consciousness: awake, alert  and patient cooperative  Airway and Oxygen Therapy: Patient Spontanous Breathing and Patient connected to supplemental oxygen  Post-op Assessment: Post-op Vital signs reviewed, Patient's Cardiovascular Status Stable, Respiratory Function Stable, Patent Airway and No signs of Nausea or vomiting  Post-op Vital Signs: Reviewed and stable  Complications: No apparent anesthesia complications

## 2017-10-26 NOTE — Op Note (Signed)
[facility] Gastroenterology Patient Name: Amanda Winters Procedure Date: 10/26/2017 10:39 AM MRN: [id number] Account #: [id number] Date of Birth: [birth year] Admit Type: Outpatient Age: 52 Room: [facility] OR ROOM 01 Gender: Female Note Status: Finalized Procedure:            Colonoscopy Indications:          Screening for colorectal malignant neoplasm Providers:            Lucilla Lame MD, MD Referring MD:         Sofie Hartigan (Referring MD) Medicines:            Propofol per Anesthesia Complications:        No immediate complications. Procedure:            Pre-Anesthesia Assessment:                       - Prior to the procedure, a History and Physical was                        performed, and patient medications and allergies were                        reviewed. The patient's tolerance of previous                        anesthesia was also reviewed. The risks and benefits of                        the procedure and the sedation options and risks were                        discussed with the patient. All questions were                        answered, and informed consent was obtained. Prior                        Anticoagulants: The patient has taken no previous                        anticoagulant or antiplatelet agents. ASA Grade                        Assessment: II - A patient with mild systemic disease.                        After reviewing the risks and benefits, the patient was                        deemed in satisfactory condition to undergo the                        procedure.                       After obtaining informed consent, the colonoscope was                        passed under direct vision. Throughout the procedure,  the patient's blood pressure, pulse, and oxygen                        saturations were monitored continuously. The Olympus                        CF-HQ190L Colonoscope (S#. [id number]) was introduced                  through the anus and advanced to the the cecum,                        identified by appendiceal orifice and ileocecal valve.                        The colonoscopy was performed without difficulty. The                        patient tolerated the procedure well. The quality of                        the bowel preparation was excellent. Findings:      The perianal and digital rectal examinations were normal.      Non-bleeding internal hemorrhoids were found during retroflexion. The       hemorrhoids were Grade I (internal hemorrhoids that do not prolapse). Impression:           - Non-bleeding internal hemorrhoids.                       - No specimens collected. Recommendation:       - Discharge patient to home.                       - Resume previous diet.                       - Continue present medications.                       - Repeat colonoscopy in 10 years for screening unless                        any change in family history or lower GI problems. Procedure Code(s):    --- Professional ---                       [phone], Colonoscopy, flexible; diagnostic, including                        collection of specimen(s) by brushing or washing, when                        performed (separate procedure) Diagnosis Code(s):    --- Professional ---                       Z12.11, Encounter for screening for malignant neoplasm                        of colon CPT copyright 2017 American Medical Association. All rights reserved. The codes documented in this report are preliminary and upon coder review may  be revised  to meet current compliance requirements. Lucilla Lame MD, MD 10/26/2017 11:07:32 AM This report has been signed electronically. Number of Addenda: 0 Note Initiated On: 10/26/2017 10:39 AM Scope Withdrawal Time: 0 hours 8 minutes 59 seconds  Total Procedure Duration: 0 hours 16 minutes 45 seconds       [facility]

## 2017-10-26 NOTE — H&P (Signed)
Lucilla Lame, MD [address] [place], [place] [address] Phone: [phone] Fax : [phone]  Primary Care Physician:  Sofie Hartigan, MD Primary Gastroenterologist:  Dr. Allen Norris  Pre-Procedure History & Physical: HPI:  Amanda Winters is a 52 y.o. female is here for a screening colonoscopy.   Past Medical History:  Diagnosis Date  . Anxiety   . Asthma    no inhalers  . Breast cancer ([place]) 2015   RT LUMPECTOMY  . COPD (chronic obstructive pulmonary disease) ([place])   . Depression   . 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 Personal history of radiation therapy 2015   BREAST CA    Past Surgical History:  Procedure Laterality Date  . BREAST BIOPSY Right 05/05/2014   Stereo biopsy (positive)  . BREAST BIOPSY Left 05/19/2014   stereo biopsy (negative)  . BREAST LUMPECTOMY Right 06/05/2014   right breast lumpectomy -  radiation treatment  . CYSTOSCOPY  05/31/2015   Procedure: CYSTOSCOPY;  Surgeon: Benjaman Kindler, MD;  Location: [facility] ORS;  Service: Gynecology;;  . SALPINGOOPHORECTOMY Bilateral 05/31/2015   Procedure: SALPINGO OOPHORECTOMY;  Surgeon: Benjaman Kindler, MD;  Location: [facility] ORS;  Service: Gynecology;  Laterality: Bilateral;  . VAGINAL HYSTERECTOMY N/A 05/31/2015   Procedure: HYSTERECTOMY VAGINAL;  Surgeon: Benjaman Kindler, MD;  Location: [facility] ORS;  Service: Gynecology;  Laterality: N/A;    Prior to Admission medications   Medication Sig Start Date End Date Taking? Authorizing Provider  buPROPion (WELLBUTRIN XL) 150 MG 24 hr tablet Take 150 mg by mouth daily.   Yes [provider]  FLUoxetine (PROZAC) 40 MG capsule Take 40 mg by mouth daily.   Yes [provider]  Triamcinolone Acetonide (NASACORT ALLERGY 24HR NA) Place into the nose daily.   Yes [provider]  calcium carbonate 1250 MG capsule Take 1,250 mg by mouth 2 (two) times daily with a meal.    [provider]  cholecalciferol (VITAMIN  D) 1000 units tablet Take 1,000 Units by mouth daily.    [provider]  diphenhydrAMINE (BENADRYL) 25 mg capsule Take by mouth. Reported on 07/14/2015    [provider]  EVENING PRIMROSE OIL PO Take by mouth.    [provider]  ibuprofen (ADVIL,MOTRIN) 600 MG tablet Take 1 tablet (600 mg total) by mouth every 6 (six) hours as needed (mild pain). Patient not taking: Reported on 07/14/2015 06/01/15   Benjaman Kindler, MD  Multiple Vitamins-Minerals (CENTRUM SILVER PO) Take by mouth.    [provider]    Allergies as of 09/21/2017  . (No Known Allergies)    Family History  Problem Relation Age of Onset  . Diabetes Mother   . Hypercholesterolemia Mother   . Dementia Mother   . Breast cancer Paternal Grandmother     Social History   Socioeconomic History  . Marital status: Married    Spouse name: Not on file  . Number of children: Not on file  . Years of education: Not on file  . Highest education level: Not on file  Occupational History  . Not on file  Social Needs  . Financial resource strain: Not on file  . Food insecurity:    Worry: Not on file    Inability: Not on file  . Transportation needs:    Medical: Not on file    Non-medical: Not on file  Tobacco Use  . Smoking status: Former Smoker    Packs/day: 1.00  Types: Cigarettes    Last attempt to quit: 09/18/1998    Years since quitting: 19.1  . Smokeless tobacco: Never Used  Substance and Sexual Activity  . Alcohol use: Yes    Alcohol/week: 0.6 - 1.2 oz    Types: 1 - 2 Glasses of wine per week    Comment: occ  . Drug use: No  . Sexual activity: Never  Lifestyle  . Physical activity:    Days per week: Not on file    Minutes per session: Not on file  . Stress: Not on file  Relationships  . Social connections:    Talks on phone: Not on file    Gets together: Not on file    Attends religious service: Not on file    Active member of club or organization: Not on file     Attends meetings of clubs or organizations: Not on file    Relationship status: Not on file  . Intimate partner violence:    Fear of current or ex partner: Not on file    Emotionally abused: Not on file    Physically abused: Not on file    Forced sexual activity: Not on file  Other Topics Concern  . Not on file  Social History Narrative  . Not on file    Review of Systems: See HPI, otherwise negative ROS  Physical Exam: Ht 5\' 9"  (1.753 m)   Wt 182 lb (82.6 kg)   BMI 26.88 kg/m  General:   Alert,  pleasant and cooperative in NAD Head:  Normocephalic and atraumatic. Neck:  Supple; no masses or thyromegaly. Lungs:  Clear throughout to auscultation.    Heart:  Regular rate and rhythm. Abdomen:  Soft, nontender and nondistended. Normal bowel sounds, without guarding, and without rebound.   Neurologic:  Alert and  oriented x4;  grossly normal neurologically.  Impression/Plan: Amanda Winters is now here to undergo a screening colonoscopy.  Risks, benefits, and alternatives regarding colonoscopy have been reviewed with the patient.  Questions have been answered.  All parties agreeable.

## 2017-10-26 NOTE — Anesthesia Preprocedure Evaluation (Signed)
Anesthesia Evaluation  Patient identified by MRN, date of birth, ID band Patient awake    Reviewed: Allergy & Precautions, H&P , NPO status , Patient's Chart, lab work & pertinent test results  Airway Mallampati: II  TM Distance: >3 FB Neck ROM: full    Dental no notable dental hx.    Pulmonary asthma , COPD, former smoker,    Pulmonary exam normal        Cardiovascular negative cardio ROS Normal cardiovascular exam     Neuro/Psych    GI/Hepatic negative GI ROS, Neg liver ROS,   Endo/Other  negative endocrine ROS  Renal/GU negative Renal ROS  negative genitourinary   Musculoskeletal   Abdominal   Peds  Hematology negative hematology ROS (+)   Anesthesia Other Findings   Reproductive/Obstetrics negative OB ROS                             Anesthesia Physical Anesthesia Plan  ASA: II  Anesthesia Plan: General   Post-op Pain Management:    Induction:   PONV Risk Score and Plan:   Airway Management Planned:   Additional Equipment:   Intra-op Plan:   Post-operative Plan:   Informed Consent: I have reviewed the patients History and Physical, chart, labs and discussed the procedure including the risks, benefits and alternatives for the proposed anesthesia with the patient or authorized representative who has indicated his/her understanding and acceptance.     Plan Discussed with:   Anesthesia Plan Comments:         Anesthesia Quick Evaluation

## 2017-10-26 NOTE — Anesthesia Postprocedure Evaluation (Signed)
Anesthesia Post Note  Patient: Amanda Winters  Procedure(s) Performed: COLONOSCOPY WITH PROPOFOL (N/A Rectum)  Patient location during evaluation: PACU Anesthesia Type: General Level of consciousness: awake and alert Pain management: pain level controlled Vital Signs Assessment: post-procedure vital signs reviewed and stable Respiratory status: spontaneous breathing Cardiovascular status: blood pressure returned to baseline Postop Assessment: no headache Anesthetic complications: no    Jaci Standard, III,  Maythe Deramo D

## 2017-10-29 ENCOUNTER — Encounter: Payer: Self-pay | Admitting: Gastroenterology

## 2017-11-28 ENCOUNTER — Other Ambulatory Visit: Payer: Self-pay | Admitting: Obstetrics and Gynecology

## 2017-11-28 DIAGNOSIS — Z1231 Encounter for screening mammogram for malignant neoplasm of breast: Secondary | ICD-10-CM

## 2018-04-11 ENCOUNTER — Other Ambulatory Visit: Payer: Self-pay | Admitting: Family Medicine

## 2018-04-12 ENCOUNTER — Other Ambulatory Visit: Payer: Self-pay | Admitting: Family Medicine

## 2018-04-12 DIAGNOSIS — Z853 Personal history of malignant neoplasm of breast: Secondary | ICD-10-CM

## 2018-05-03 ENCOUNTER — Ambulatory Visit
Admission: RE | Admit: 2018-05-03 | Discharge: 2018-05-03 | Disposition: A | Discharge status: Home / Self Care | Payer: Managed Care, Other (non HMO) | Source: Ambulatory Visit | Attending: Family Medicine | Admitting: Family Medicine

## 2018-05-03 DIAGNOSIS — Z853 Personal history of malignant neoplasm of breast: Secondary | ICD-10-CM | POA: Diagnosis not present

## 2019-04-15 ENCOUNTER — Other Ambulatory Visit: Payer: Self-pay | Admitting: Family Medicine

## 2019-04-15 DIAGNOSIS — Z853 Personal history of malignant neoplasm of breast: Secondary | ICD-10-CM

## 2019-05-05 ENCOUNTER — Ambulatory Visit
Admission: RE | Admit: 2019-05-05 | Discharge: 2019-05-05 | Disposition: A | Discharge status: Home / Self Care | Payer: Managed Care, Other (non HMO) | Source: Ambulatory Visit | Attending: Family Medicine | Admitting: Family Medicine

## 2019-05-05 DIAGNOSIS — Z853 Personal history of malignant neoplasm of breast: Secondary | ICD-10-CM | POA: Insufficient documentation

## 2020-04-13 ENCOUNTER — Other Ambulatory Visit: Payer: Self-pay | Admitting: Certified Nurse Midwife

## 2020-04-13 DIAGNOSIS — Z1231 Encounter for screening mammogram for malignant neoplasm of breast: Secondary | ICD-10-CM

## 2020-05-06 ENCOUNTER — Other Ambulatory Visit: Payer: Self-pay

## 2020-05-06 ENCOUNTER — Ambulatory Visit
Admission: RE | Admit: 2020-05-06 | Discharge: 2020-05-06 | Disposition: A | Discharge status: Home / Self Care | Payer: Managed Care, Other (non HMO) | Source: Ambulatory Visit | Attending: Certified Nurse Midwife | Admitting: Certified Nurse Midwife

## 2020-05-06 DIAGNOSIS — Z1231 Encounter for screening mammogram for malignant neoplasm of breast: Secondary | ICD-10-CM | POA: Diagnosis present

## 2021-04-20 ENCOUNTER — Other Ambulatory Visit: Payer: Self-pay | Admitting: Certified Nurse Midwife

## 2021-04-20 DIAGNOSIS — Z1231 Encounter for screening mammogram for malignant neoplasm of breast: Secondary | ICD-10-CM

## 2021-05-17 ENCOUNTER — Ambulatory Visit
Admission: RE | Admit: 2021-05-17 | Discharge: 2021-05-17 | Disposition: A | Discharge status: Home / Self Care | Payer: Managed Care, Other (non HMO) | Source: Ambulatory Visit | Attending: Certified Nurse Midwife | Admitting: Certified Nurse Midwife

## 2021-05-17 ENCOUNTER — Other Ambulatory Visit: Payer: Self-pay

## 2021-05-17 DIAGNOSIS — Z1231 Encounter for screening mammogram for malignant neoplasm of breast: Secondary | ICD-10-CM | POA: Diagnosis not present

## 2022-04-25 ENCOUNTER — Other Ambulatory Visit: Payer: Self-pay | Admitting: Obstetrics and Gynecology

## 2022-04-25 DIAGNOSIS — Z1231 Encounter for screening mammogram for malignant neoplasm of breast: Secondary | ICD-10-CM

## 2022-05-18 ENCOUNTER — Ambulatory Visit
Admission: RE | Admit: 2022-05-18 | Discharge: 2022-05-18 | Disposition: A | Discharge status: Home / Self Care | Payer: Managed Care, Other (non HMO) | Source: Ambulatory Visit | Attending: Obstetrics and Gynecology | Admitting: Obstetrics and Gynecology

## 2022-05-18 DIAGNOSIS — Z1231 Encounter for screening mammogram for malignant neoplasm of breast: Secondary | ICD-10-CM | POA: Insufficient documentation

## 2023-01-10 ENCOUNTER — Other Ambulatory Visit: Payer: Self-pay | Admitting: Nurse Practitioner

## 2023-01-10 DIAGNOSIS — R1011 Right upper quadrant pain: Secondary | ICD-10-CM

## 2023-01-11 ENCOUNTER — Ambulatory Visit
Admission: RE | Admit: 2023-01-11 | Discharge: 2023-01-11 | Disposition: A | Discharge status: Home / Self Care | Payer: Managed Care, Other (non HMO) | Source: Ambulatory Visit | Attending: Nurse Practitioner | Admitting: Nurse Practitioner

## 2023-01-11 DIAGNOSIS — R1011 Right upper quadrant pain: Secondary | ICD-10-CM | POA: Diagnosis present

## 2023-05-15 ENCOUNTER — Other Ambulatory Visit: Payer: Self-pay | Admitting: Obstetrics and Gynecology

## 2023-05-15 DIAGNOSIS — Z1231 Encounter for screening mammogram for malignant neoplasm of breast: Secondary | ICD-10-CM

## 2023-05-23 ENCOUNTER — Ambulatory Visit
Admission: RE | Admit: 2023-05-23 | Discharge: 2023-05-23 | Disposition: A | Discharge status: Home / Self Care | Payer: Managed Care, Other (non HMO) | Source: Ambulatory Visit | Attending: Obstetrics and Gynecology | Admitting: Obstetrics and Gynecology

## 2023-05-23 DIAGNOSIS — Z1231 Encounter for screening mammogram for malignant neoplasm of breast: Secondary | ICD-10-CM | POA: Diagnosis present

## 2024-05-21 ENCOUNTER — Other Ambulatory Visit: Payer: Self-pay | Admitting: Obstetrics and Gynecology

## 2024-05-21 DIAGNOSIS — Z1231 Encounter for screening mammogram for malignant neoplasm of breast: Secondary | ICD-10-CM

## 2024-05-26 ENCOUNTER — Ambulatory Visit
Admission: RE | Admit: 2024-05-26 | Discharge: 2024-05-26 | Attending: Obstetrics and Gynecology | Admitting: Obstetrics and Gynecology

## 2024-05-26 DIAGNOSIS — Z1231 Encounter for screening mammogram for malignant neoplasm of breast: Secondary | ICD-10-CM
